# Patient Record
Sex: Male | Born: 1985 | Race: White | Hispanic: No | Marital: Single | Smoking: Current every day smoker
Health system: Southern US, Community
[De-identification: ages and names within clinical notes are randomized; demographics above are authoritative.]

## PROBLEM LIST (undated history)

## (undated) DIAGNOSIS — F32A Depression, unspecified: Secondary | ICD-10-CM

## (undated) DIAGNOSIS — F329 Major depressive disorder, single episode, unspecified: Secondary | ICD-10-CM

---
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
## 2008-02-20 ENCOUNTER — Emergency Department (HOSPITAL_COMMUNITY): Admission: EM | Admit: 2008-02-20 | Discharge: 2008-02-20 | Payer: Self-pay | Admitting: Emergency Medicine

## 2011-06-25 ENCOUNTER — Inpatient Hospital Stay (INDEPENDENT_AMBULATORY_CARE_PROVIDER_SITE_OTHER)
Admission: RE | Admit: 2011-06-25 | Discharge: 2011-06-25 | Disposition: A | Discharge status: Home / Self Care | Payer: Self-pay | Source: Ambulatory Visit | Attending: Family Medicine | Admitting: Family Medicine

## 2011-06-25 ENCOUNTER — Emergency Department (HOSPITAL_COMMUNITY): Payer: Self-pay

## 2011-06-25 ENCOUNTER — Emergency Department (HOSPITAL_COMMUNITY)
Admission: EM | Admit: 2011-06-25 | Discharge: 2011-06-25 | Disposition: A | Discharge status: Home / Self Care | Payer: Self-pay | Attending: Emergency Medicine | Admitting: Emergency Medicine

## 2011-06-25 DIAGNOSIS — H5710 Ocular pain, unspecified eye: Secondary | ICD-10-CM | POA: Insufficient documentation

## 2011-06-25 DIAGNOSIS — IMO0002 Reserved for concepts with insufficient information to code with codable children: Secondary | ICD-10-CM | POA: Insufficient documentation

## 2011-06-25 DIAGNOSIS — S1093XA Contusion of unspecified part of neck, initial encounter: Secondary | ICD-10-CM | POA: Insufficient documentation

## 2011-06-25 DIAGNOSIS — R51 Headache: Secondary | ICD-10-CM | POA: Insufficient documentation

## 2011-06-25 DIAGNOSIS — R42 Dizziness and giddiness: Secondary | ICD-10-CM | POA: Insufficient documentation

## 2011-06-25 DIAGNOSIS — S0510XA Contusion of eyeball and orbital tissues, unspecified eye, initial encounter: Secondary | ICD-10-CM | POA: Insufficient documentation

## 2011-06-25 DIAGNOSIS — S0003XA Contusion of scalp, initial encounter: Secondary | ICD-10-CM | POA: Insufficient documentation

## 2011-06-25 DIAGNOSIS — W19XXXA Unspecified fall, initial encounter: Secondary | ICD-10-CM | POA: Insufficient documentation

## 2011-06-25 DIAGNOSIS — S0990XA Unspecified injury of head, initial encounter: Secondary | ICD-10-CM | POA: Insufficient documentation

## 2014-03-14 ENCOUNTER — Encounter (HOSPITAL_COMMUNITY): Payer: Self-pay | Admitting: Emergency Medicine

## 2014-03-14 ENCOUNTER — Emergency Department (HOSPITAL_COMMUNITY)
Admission: EM | Admit: 2014-03-14 | Discharge: 2014-03-15 | Disposition: A | Discharge status: Home / Self Care | Payer: Self-pay | Attending: Emergency Medicine | Admitting: Emergency Medicine

## 2014-03-14 DIAGNOSIS — Z5900 Homelessness unspecified: Secondary | ICD-10-CM | POA: Insufficient documentation

## 2014-03-14 DIAGNOSIS — F17200 Nicotine dependence, unspecified, uncomplicated: Secondary | ICD-10-CM | POA: Insufficient documentation

## 2014-03-14 DIAGNOSIS — F1010 Alcohol abuse, uncomplicated: Secondary | ICD-10-CM | POA: Insufficient documentation

## 2014-03-14 HISTORY — DX: Major depressive disorder, single episode, unspecified: F32.9

## 2014-03-14 HISTORY — DX: Depression, unspecified: F32.A

## 2014-03-14 LAB — COMPREHENSIVE METABOLIC PANEL
ALBUMIN: 4.2 g/dL (ref 3.5–5.2)
ALT: 206 U/L — AB (ref 0–53)
AST: 138 U/L — AB (ref 0–37)
Alkaline Phosphatase: 117 U/L (ref 39–117)
BILIRUBIN TOTAL: 0.9 mg/dL (ref 0.3–1.2)
BUN: 10 mg/dL (ref 6–23)
CALCIUM: 10.1 mg/dL (ref 8.4–10.5)
CHLORIDE: 93 meq/L — AB (ref 96–112)
CO2: 26 mEq/L (ref 19–32)
CREATININE: 0.86 mg/dL (ref 0.50–1.35)
GFR calc Af Amer: >90 mL/min (ref >90)
GFR calc non Af Amer: >90 mL/min (ref >90)
Glucose, Bld: 89 mg/dL (ref 70–99)
Potassium: 4.4 mEq/L (ref 3.7–5.3)
SODIUM: 137 meq/L (ref 137–147)
Total Protein: 7.5 g/dL (ref 6.0–8.3)

## 2014-03-14 LAB — CBC
HCT: 46.7 % (ref 39.0–52.0)
Hemoglobin: 16.1 g/dL (ref 13.0–17.0)
MCH: 30.8 pg (ref 26.0–34.0)
MCHC: 34.5 g/dL (ref 30.0–36.0)
MCV: 89.5 fL (ref 78.0–100.0)
PLATELETS: 167 10*3/uL (ref 150–400)
RBC: 5.22 MIL/uL (ref 4.22–5.81)
RDW: 13.4 % (ref 11.5–15.5)
WBC: 6.4 10*3/uL (ref 4.0–10.5)

## 2014-03-14 LAB — RAPID URINE DRUG SCREEN, HOSP PERFORMED
AMPHETAMINES: NOT DETECTED
BARBITURATES: NOT DETECTED
Benzodiazepines: NOT DETECTED
COCAINE: NOT DETECTED
OPIATES: NOT DETECTED
TETRAHYDROCANNABINOL: NOT DETECTED

## 2014-03-14 LAB — ETHANOL: Alcohol, Ethyl (B): 52 mg/dL — ABNORMAL HIGH (ref 0–11)

## 2014-03-14 MED ORDER — VITAMIN B-1 100 MG PO TABS
100.0000 mg | ORAL_TABLET | Freq: Every day | ORAL | Status: DC
  Administered 2014-03-14 – 2014-03-15 (×2): 100 mg via ORAL
  Filled 2014-03-14 (×2): qty 1

## 2014-03-14 MED ORDER — IBUPROFEN 200 MG PO TABS: 600.0000 mg | ORAL_TABLET | Freq: Three times a day (TID) | ORAL | Status: DC | PRN

## 2014-03-14 MED ORDER — ACETAMINOPHEN 325 MG PO TABS: 650.0000 mg | ORAL_TABLET | ORAL | Status: DC | PRN

## 2014-03-14 MED ORDER — ONDANSETRON HCL 4 MG PO TABS
4.0000 mg | ORAL_TABLET | Freq: Three times a day (TID) | ORAL | Status: DC | PRN
  Administered 2014-03-15: 4 mg via ORAL
  Filled 2014-03-14: qty 1

## 2014-03-14 MED ORDER — FOLIC ACID 1 MG PO TABS
1.0000 mg | ORAL_TABLET | Freq: Every day | ORAL | Status: DC
  Administered 2014-03-14 – 2014-03-15 (×2): 1 mg via ORAL
  Filled 2014-03-14 (×2): qty 1

## 2014-03-14 MED ORDER — ZOLPIDEM TARTRATE 5 MG PO TABS: 5.0000 mg | ORAL_TABLET | Freq: Every evening | ORAL | Status: DC | PRN

## 2014-03-14 MED ORDER — NICOTINE 21 MG/24HR TD PT24
21.0000 mg | MEDICATED_PATCH | Freq: Every day | TRANSDERMAL | Status: DC
  Filled 2014-03-14: qty 1

## 2014-03-14 MED ORDER — LORAZEPAM 1 MG PO TABS: 0.0000 mg | ORAL_TABLET | Freq: Two times a day (BID) | ORAL | Status: DC

## 2014-03-14 MED ORDER — LORAZEPAM 1 MG PO TABS
0.0000 mg | ORAL_TABLET | Freq: Four times a day (QID) | ORAL | Status: DC
  Administered 2014-03-14 – 2014-03-15 (×2): 1 mg via ORAL
  Administered 2014-03-15: 2 mg via ORAL
  Filled 2014-03-14: qty 2
  Filled 2014-03-14 (×2): qty 1

## 2014-03-14 MED ORDER — ALUM & MAG HYDROXIDE-SIMETH 200-200-20 MG/5ML PO SUSP: 30.0000 mL | ORAL | Status: DC | PRN

## 2014-03-14 NOTE — ED Provider Notes (Signed)
TIME SEEN: 8:20 PM  CHIEF COMPLAINT: "I need detox from alcohol"  HPI: Patient is a 28 y.o. M with history of alcohol abuse for many years who presents emergency department requesting detox. He states that he was sober for approximately 149 days last year after a stent in rehabilitation. He states that he has been drinking again heavily and drinks approximately 4 40 ounce beers daily. He does smoke marijuana occasionally but denies any other drug use. He has had intermittent vomiting but no other medical complaints. No SI, HI or hallucinations. He states when he stops streaking he become shaky, sweaty and began to vomit. No history of withdrawal seizures or hallucinations. He states he has an appointment to go to [facility] tomorrow but feels that he needs inpatient detox.  ROS: See HPI Constitutional: no fever  Eyes: no drainage  ENT: no runny nose   Cardiovascular:  no chest pain  Resp: no SOB  GI: no vomiting GU: no dysuria Integumentary: no rash  Allergy: no hives  Musculoskeletal: no leg swelling  Neurological: no slurred speech ROS otherwise negative  PAST MEDICAL HISTORY/PAST SURGICAL HISTORY:  History reviewed. No pertinent past medical history.  MEDICATIONS:  Prior to Admission medications   Not on File    ALLERGIES:  No Known Allergies  SOCIAL HISTORY:  History  Substance Use Topics  . Smoking status: Current Every Day Smoker    Types: Cigarettes  . Smokeless tobacco: Not on file  . Alcohol Use: 2.4 oz/week    4 Cans of beer per week    FAMILY HISTORY: No family history on file.  EXAM: BP 137/88  Pulse 87  Temp(Src) 98.6 F (37 C) (Oral)  Resp 18  SpO2 97% CONSTITUTIONAL: Alert and oriented and responds appropriately to questions. Well-appearing; well-nourished, smells of alcohol HEAD: Normocephalic EYES: Conjunctivae clear, PERRL ENT: normal nose; no rhinorrhea; moist mucous membranes; pharynx without lesions noted NECK: Supple, no meningismus, no LAD   CARD: RRR; S1 and S2 appreciated; no murmurs, no clicks, no rubs, no gallops RESP: Normal chest excursion without splinting or tachypnea; breath sounds clear and equal bilaterally; no wheezes, no rhonchi, no rales,  ABD/GI: Normal bowel sounds; non-distended; soft, non-tender, no rebound, no guarding BACK:  The back appears normal and is non-tender to palpation, there is no CVA tenderness EXT: Normal ROM in all joints; non-tender to palpation; no edema; normal capillary refill; no cyanosis    SKIN: Normal color for age and race; warm NEURO: Moves all extremities equally PSYCH: The patient's mood and manner are appropriate. Grooming and personal hygiene are appropriate. No SI, HI or hallucinations  MEDICAL DECISION MAKING: Patient here requesting alcohol detox. He states he becomes diaphoretic, anxious, tremulous when he stops drinking. Last drink was just prior to arrival. Denies any current medical complaints. We'll obtain screening labs and urine and consult TTS. Patient is here voluntarily.  ED PROGRESS: Patient's labs are unremarkable other than mild elevation of his AST greater than ALT consistent with alcohol abuse. His alcohol level is 52. He is currently medically cleared. Awaiting TTS evaluation.     Layla Maw Niclas Markell, DO 03/15/14 0008

## 2014-03-14 NOTE — ED Notes (Signed)
Pt alert, arrives from home, c/o detox from alcohol, last drink was pta, pt reports drinking "(4) forty's/day"

## 2014-03-14 NOTE — ED Notes (Signed)
Pt belongings include:  Black wallet Loose change; quarters, nickels White t-shirt White socks Red basketball style shorts Underwear White shirt White and black shirt Black and silver shorts Half a bag of Fritos White Tennis Shoes Black Sandals   Belongs logged by [place], Advertising copywriter 1 on this date at this time.

## 2014-03-14 NOTE — ED Notes (Signed)
Pt has been wanded by security and belongings searched.

## 2014-03-15 ENCOUNTER — Encounter (HOSPITAL_COMMUNITY): Payer: Self-pay | Admitting: Emergency Medicine

## 2014-03-15 DIAGNOSIS — F1020 Alcohol dependence, uncomplicated: Secondary | ICD-10-CM

## 2014-03-15 MED ORDER — LORAZEPAM 1 MG PO TABS: 1.0000 mg | ORAL_TABLET | ORAL | Status: AC | PRN

## 2014-03-15 NOTE — BH Assessment (Signed)
Tele Assessment Note   Duane Mclean is a 28 y.o. male who presents to [facility] for alcohol detox. Pt denies SI/HI/AVH.  Pt reports the following: he is going through a "bad breakup" and has been drinking excessively. Pt says he had 149 of sobriety and relapsed on 09/2013. Pt drinks 3-4 40's or more, daily, last drink was 03/14/14.  Pt drank 1-12oz beer.  Pt says he occasionally uses 1 marijuana joint, last use was 3 days ago.  Pt says he is homeless and is currently living with his parents.  Pt denies seizures or blackouts.  He is c/o w/d sxs: hot flashes, tremors, anxiety and insomnia.   Axis I: Alcohol Abuse  Axis II: Deferred Axis III:  Past Medical History  Diagnosis Date  . Depression    Axis IV: economic problems, housing problems, other psychosocial or environmental problems, problems related to social environment and problems with primary support group Axis V: 41-50 serious symptoms  Past Medical History:  Past Medical History  Diagnosis Date  . Depression     History reviewed. No pertinent past surgical history.  Family History: No family history on file.  Social History:  reports that he has been smoking Cigarettes.  He has been smoking about 0.00 packs per day. He does not have any smokeless tobacco history on file. He reports that he drinks about 2.4 ounces of alcohol per week. His drug history is not on file.  Additional Social History:  Alcohol / Drug Use Pain Medications: None  Prescriptions: None  Over the Counter: None  History of alcohol / drug use?: Yes Longest period of sobriety (when/how long): 149 days sobriety; relapsed 09/2013 Negative Consequences of Use: Work / School;Personal relationships;Financial Withdrawal Symptoms: Fever / Chills;Tremors;Other (Comment) (anxiety, insomnia ) Substance #1 Name of Substance 1: Alcohol  1 - Age of First Use: 13 YOM  1 - Amount (size/oz): 3-4 40's or more  1 - Frequency: Daily  1 - Duration: On-going  1 - Last Use /  Amount: 03/14/14 Substance #2 Name of Substance 2: THC  2 - Age of First Use: Teens  2 - Amount (size/oz): 1 Joint  2 - Frequency: Occasionally  2 - Duration: On-going  2 - Last Use / Amount: 3 Days Ago   CIWA: CIWA-Ar BP: 134/93 mmHg Pulse Rate: 112 Nausea and Vomiting: no nausea and no vomiting Tactile Disturbances: moderate itching, pins and needles, burning or numbness Tremor: moderate, with patient's arms extended Auditory Disturbances: not present Paroxysmal Sweats: barely perceptible sweating, palms moist Visual Disturbances: not present Anxiety: two Headache, Fullness in Head: very mild Agitation: normal activity Orientation and Clouding of Sensorium: oriented and can do serial additions CIWA-Ar Total: 11 COWS:    Allergies: No Known Allergies  Home Medications:  (Not in a hospital admission)  OB/GYN Status:  No LMP for male patient.  General Assessment Data Location of Assessment: [facility] ED Is this a Tele or Face-to-Face Assessment?: Face-to-Face Is this an Initial Assessment or a Re-assessment for this encounter?: Initial Assessment Living Arrangements: Parent ("sleeping on my parents couch") Can pt return to current living arrangement?: Yes Admission Status: Voluntary Is patient capable of signing voluntary admission?: Yes Transfer from: Acute Hospital Referral Source: MD  Medical Screening Exam [facility] Walk-in ONLY) Medical Exam completed: No Reason for MSE not completed: Other: (None )  [facility] Crisis Care Plan Living Arrangements: Parent ("sleeping on my parents couch") Name of Psychiatrist: None  Name of Therapist: None   Education Status Is  patient currently in school?: No Current Grade: None  Highest grade of school patient has completed: None  Name of school: None  Contact person: None   Risk to self Suicidal Ideation: No Suicidal Intent: No Is patient at risk for suicide?: No Suicidal Plan?: No Access to Means: No What has been your use of  drugs/alcohol within the last 12 months?: Abusing: alcohol Previous Attempts/Gestures: No How many times?: 0 Other Self Harm Risks: None  Triggers for Past Attempts: None known Intentional Self Injurious Behavior: None Family Suicide History: No Recent stressful life event(s): Conflict (Comment);Loss (Comment) ("Bad breakup with girlfriend") Persecutory voices/beliefs?: No Depression: Yes Depression Symptoms: Loss of interest in usual pleasures Substance abuse history and/or treatment for substance abuse?: Yes Suicide prevention information given to non-admitted patients: Not applicable  Risk to Others Homicidal Ideation: No Thoughts of Harm to Others: No Current Homicidal Intent: No Current Homicidal Plan: No Access to Homicidal Means: No Identified Victim: None  History of harm to others?: No Assessment of Violence: None Noted Violent Behavior Description: None  Does patient have access to weapons?: No Criminal Charges Pending?: No Does patient have a court date: No  Psychosis Hallucinations: None noted Delusions: None noted  Mental Status Report Appear/Hygiene: Disheveled;In scrubs Eye Contact: Good Motor Activity: Unremarkable Speech: Logical/coherent Level of Consciousness: Alert Mood: Depressed Affect: Depressed Anxiety Level: None Thought Processes: Coherent;Relevant Judgement: Unimpaired Orientation: Person;Place;Time;Situation Obsessive Compulsive Thoughts/Behaviors: None  Cognitive Functioning Concentration: Normal Memory: Recent Intact;Remote Intact IQ: Average Insight: Fair Impulse Control: Fair Appetite: Fair Weight Loss: 0 Weight Gain: 0 Sleep: No Change Total Hours of Sleep: 6 Vegetative Symptoms: None  ADLScreening [facility] Assessment Services) Patient's cognitive ability adequate to safely complete daily activities?: Yes Patient able to express need for assistance with ADLs?: Yes Independently performs ADLs?: Yes (appropriate for  developmental age)  Prior Inpatient Therapy Prior Inpatient Therapy: Yes Prior Therapy Dates: 2014 Prior Therapy Facilty/Provider(s): [facility] Reason for Treatment: Rehab   Prior Outpatient Therapy Prior Outpatient Therapy: No Prior Therapy Dates: None  Prior Therapy Facilty/Provider(s): None  Reason for Treatment: None   ADL Screening (condition at time of admission) Patient's cognitive ability adequate to safely complete daily activities?: Yes Is the patient deaf or have difficulty hearing?: No Does the patient have difficulty seeing, even when wearing glasses/contacts?: No Does the patient have difficulty concentrating, remembering, or making decisions?: No Patient able to express need for assistance with ADLs?: Yes Does the patient have difficulty dressing or bathing?: No Independently performs ADLs?: Yes (appropriate for developmental age) Does the patient have difficulty walking or climbing stairs?: No Weakness of Legs: None Weakness of Arms/Hands: None  Home Assistive Devices/Equipment Home Assistive Devices/Equipment: None  Therapy Consults (therapy consults require a physician order) PT Evaluation Needed: No OT Evalulation Needed: No SLP Evaluation Needed: No Abuse/Neglect Assessment (Assessment to be complete while patient is alone) Physical Abuse: Denies Verbal Abuse: Denies Sexual Abuse: Denies Exploitation of patient/patient's resources: Denies Self-Neglect: Denies Values / Beliefs Cultural Requests During Hospitalization: None Spiritual Requests During Hospitalization: None Consults Spiritual Care Consult Needed: No Social Work Consult Needed: No Merchant navy officer (For Healthcare) Advance Directive: Patient does not have advance directive;Patient would not like information Pre-existing out of facility DNR order (yellow form or pink MOST form): No Nutrition Screen- [facility] Patient's home diet: Regular  Additional Information 1:1 In Past 12  Months?: No CIRT Risk: No Elopement Risk: No Does patient have medical clearance?: Yes     Disposition:  Disposition Initial Assessment Completed  for this Encounter: Yes Disposition of Patient: Inpatient treatment program;Referred to [facility] Type of inpatient treatment program: Adult Patient referred to: Other (Comment) [facility] )  Murrell Reddeneresa C Shawne Bulow 03/15/2014 4:04 AM

## 2014-03-15 NOTE — [facility] Counselor (Addendum)
Writer spoke w/ [employer]. She sts that pt needs to arrive by 8 am tomorrow 5/28 at [facility]. Pt will need to bring his meds and his clothes. She sts pt will have a bed at [facility] tomorrow am. Writer relayed this info to pt. Writer reminds pt that pt will need to stay clean and sober upon d/c from [facility] today until his admission to [facility] 8 am 5/28. Pt insists that he won't use any substances upon d/c from [facility]. Writer left voicemail for Melissa at [facility] notifying her that pt no longer needs placement.   Evette Cristal, [place] Assessment Counselor 12:24 pm   Writer called [facility] and left voicemail for counselor - [phone]. Pt sts he has spoken w/ [facility] and has a bed waiting for him.   Evette Cristal, [place] Assessment Counselor 11:47 am   Melissa at [facility] - they do have detox beds. Writer sent referral to [facility] fax [phone]. Adolph at [facility] - they have no male beds.   Evette Cristal, [place] Assessment Counselor 10:00am

## 2014-03-15 NOTE — [facility] Suicide Risk Assessment (Signed)
Suicide Risk Assessment  Discharge Assessment     Demographic Factors:  Male, Divorced or widowed and Unemployed  Total Time spent with patient: 30 minutes  Psychiatric Specialty Exam:     Blood pressure 150/104, pulse 102, temperature 98.4 F (36.9 C), temperature source Oral, resp. rate 18, SpO2 97.00%.There is no height or weight on file to calculate BMI.  General Appearance: Well Groomed  Patent attorney::  Good  Speech:  Clear and Coherent  Volume:  Normal  Mood:  Anxious  Affect:  Congruent  Thought Process:  Coherent  Orientation:  Full (Time, Place, and Person)  Thought Content:  Negative  Suicidal Thoughts:  No  Homicidal Thoughts:  No  Memory:  Immediate;   Good Recent;   Good Remote;   Good  Judgement:  Intact  Insight:  Fair  Psychomotor Activity:  Normal  Concentration:  Good  Recall:  Good  Fund of Knowledge:Good  Language: Good  Akathisia:  Negative  Handed:  Right  AIMS (if indicated):     Assets:  Communication Skills Desire for Improvement Physical Health Social Support Talents/Skills Transportation  Sleep:       Musculoskeletal: Strength & Muscle Tone: within normal limits Gait & Station: normal Patient leans: N/A   Mental Status Per Nursing Assessment::   On Admission:     Current Mental Status by Physician: NA  Loss Factors: NA  Historical Factors: NA  Risk Reduction Factors:   Sense of responsibility to family and Positive social support  Continued Clinical Symptoms:  Alcohol/Substance Abuse/Dependencies  Cognitive Features That Contribute To Risk:  none    Suicide Risk:  Minimal: No identifiable suicidal ideation.  Patients presenting with no risk factors but with morbid ruminations; may be classified as minimal risk based on the severity of the depressive symptoms  Discharge Diagnoses:   AXIS I:  Alcohol Abuse AXIS II:  Deferred AXIS III:   Past Medical History  Diagnosis Date  . Depression    AXIS IV:  economic  problems, housing problems, occupational problems and other psychosocial or environmental problems AXIS V:  51-60 moderate symptoms  Plan Of Care/Follow-up recommendations:  Activity:  resume usual activity Diet:  resume usual diet  Is patient on multiple antipsychotic therapies at discharge:  No   Has Patient had three or more failed trials of antipsychotic monotherapy by history:  No  Recommended Plan for Multiple Antipsychotic Therapies: NA    Benjaman Pott 03/15/2014, 1:38 PM

## 2014-03-15 NOTE — ED Notes (Signed)
Belongings returned to patient.

## 2014-03-15 NOTE — Consult Note (Signed)
[facility] Face-to-Face Psychiatry Consult   Reason for Consult:  Requesting detox from alcohol Referring Physician:  ER MD  Duane Mclean is an 28 y.o. male. Total Time spent with patient: 30 minutes  Assessment: AXIS I:  alcohol dependence AXIS II:  Deferred AXIS III:   Past Medical History  Diagnosis Date  . Depression    AXIS IV:  economic problems, housing problems and problems related to social environment AXIS V:  61-70 mild symptoms  Plan:  No evidence of imminent risk to self or others at present.    Subjective:   Duane Mclean is a 28 y.o. male patient admitted with requesting alcohol detox.  HPI:  Drinks 4 forty oz beers daily, the last drink yesterday at 5 pm.  Has been in rehab before but started drinking again.  Broke up with his girlfriend and is homeless for now.  Cannot see his 83 young boys.  He is on probation for assault on this same woman in the past.  Has been drinking since December last year daily. HPI Elements:   Location:  alcohol dependence. Quality:  daily drinking. Severity:  four 40 oz daily. Timing:  recent break up with his girlfriend. Duration:  drinking since he was 67 years old. Context:  as above.  Past Psychiatric History: Past Medical History  Diagnosis Date  . Depression     reports that he has been smoking Cigarettes.  He has been smoking about 0.00 packs per day. He does not have any smokeless tobacco history on file. He reports that he drinks about 2.4 ounces of alcohol per week. His drug history is not on file. No family history on file. Family History Substance Abuse: No Family Supports: No Living Arrangements: Parent ("sleeping on my parents couch") Can pt return to current living arrangement?: Yes Abuse/Neglect [facility]) Physical Abuse: Denies Verbal Abuse: Denies Sexual Abuse: Denies Allergies:  No Known Allergies  ACT Assessment Complete:  Yes:    Educational Status    Risk to Self: Risk to self Suicidal Ideation: No Suicidal  Intent: No Is patient at risk for suicide?: No Suicidal Plan?: No Access to Means: No What has been your use of drugs/alcohol within the last 12 months?: Abusing: alcohol Previous Attempts/Gestures: No How many times?: 0 Other Self Harm Risks: None  Triggers for Past Attempts: None known Intentional Self Injurious Behavior: None Family Suicide History: No Recent stressful life event(s): Conflict (Comment);Loss (Comment) ("Bad breakup with girlfriend") Persecutory voices/beliefs?: No Depression: Yes Depression Symptoms: Loss of interest in usual pleasures Substance abuse history and/or treatment for substance abuse?: Yes Suicide prevention information given to non-admitted patients: Not applicable  Risk to Others: Risk to Others Homicidal Ideation: No Thoughts of Harm to Others: No Current Homicidal Intent: No Current Homicidal Plan: No Access to Homicidal Means: No Identified Victim: None  History of harm to others?: No Assessment of Violence: None Noted Violent Behavior Description: None  Does patient have access to weapons?: No Criminal Charges Pending?: No Does patient have a court date: No  Abuse: Abuse/Neglect Assessment (Assessment to be complete while patient is alone) Physical Abuse: Denies Verbal Abuse: Denies Sexual Abuse: Denies Exploitation of patient/patient's resources: Denies Self-Neglect: Denies  Prior Inpatient Therapy: Prior Inpatient Therapy Prior Inpatient Therapy: Yes Prior Therapy Dates: 2014 Prior Therapy Facilty/Provider(s): [facility] Reason for Treatment: Rehab   Prior Outpatient Therapy: Prior Outpatient Therapy Prior Outpatient Therapy: No Prior Therapy Dates: None  Prior Therapy Facilty/Provider(s): None  Reason for Treatment: None  Additional Information: Additional Information 1:1 In Past 12 Months?: No CIRT Risk: No Elopement Risk: No Does patient have medical clearance?: Yes                  Objective: Blood pressure  150/104, pulse 102, temperature 98.4 F (36.9 C), temperature source Oral, resp. rate 18, SpO2 97.00%.There is no height or weight on file to calculate BMI. Results for orders placed during the hospital encounter of 03/14/14 (from the past 72 hour(s))  CBC     Status: None   Collection Time    03/14/14  6:20 PM      Result Value Ref Range   WBC 6.4  4.0 - 10.5 K/uL   RBC 5.22  4.22 - 5.81 MIL/uL   Hemoglobin 16.1  13.0 - 17.0 g/dL   HCT 46.7  39.0 - 52.0 %   MCV 89.5  78.0 - 100.0 fL   MCH 30.8  26.0 - 34.0 pg   MCHC 34.5  30.0 - 36.0 g/dL   RDW 13.4  11.5 - 15.5 %   Platelets 167  150 - 400 K/uL  COMPREHENSIVE METABOLIC PANEL     Status: Abnormal   Collection Time    03/14/14  6:20 PM      Result Value Ref Range   Sodium 137  137 - 147 mEq/L   Potassium 4.4  3.7 - 5.3 mEq/L   Chloride 93 (*) 96 - 112 mEq/L   CO2 26  19 - 32 mEq/L   Glucose, Bld 89  70 - 99 mg/dL   BUN 10  6 - 23 mg/dL   Creatinine, Ser 0.86  0.50 - 1.35 mg/dL   Calcium 10.1  8.4 - 10.5 mg/dL   Total Protein 7.5  6.0 - 8.3 g/dL   Albumin 4.2  3.5 - 5.2 g/dL   AST 138 (*) 0 - 37 U/L   ALT 206 (*) 0 - 53 U/L   Alkaline Phosphatase 117  39 - 117 U/L   Total Bilirubin 0.9  0.3 - 1.2 mg/dL   GFR calc non Af Amer >90  >90 mL/min   GFR calc Af Amer >90  >90 mL/min   Comment: (NOTE)     The eGFR has been calculated using the CKD EPI equation.     This calculation has not been validated in all clinical situations.     eGFR's persistently <90 mL/min signify possible Chronic Kidney     Disease.  ETHANOL     Status: Abnormal   Collection Time    03/14/14  6:20 PM      Result Value Ref Range   Alcohol, Ethyl (B) 52 (*) 0 - 11 mg/dL   Comment:            LOWEST DETECTABLE LIMIT FOR     SERUM ALCOHOL IS 11 mg/dL     FOR MEDICAL PURPOSES ONLY  URINE RAPID DRUG SCREEN (HOSP PERFORMED)     Status: None   Collection Time    03/14/14  6:50 PM      Result Value Ref Range   Opiates NONE DETECTED  NONE DETECTED    Cocaine NONE DETECTED  NONE DETECTED   Benzodiazepines NONE DETECTED  NONE DETECTED   Amphetamines NONE DETECTED  NONE DETECTED   Tetrahydrocannabinol NONE DETECTED  NONE DETECTED   Barbiturates NONE DETECTED  NONE DETECTED   Comment:            DRUG SCREEN FOR MEDICAL PURPOSES  ONLY.  IF CONFIRMATION IS NEEDED     FOR ANY PURPOSE, NOTIFY LAB     WITHIN 5 DAYS.                LOWEST DETECTABLE LIMITS     FOR URINE DRUG SCREEN     Drug Class       Cutoff (ng/mL)     Amphetamine      1000     Barbiturate      200     Benzodiazepine   448     Tricyclics       185     Opiates          300     Cocaine          300     THC              50   Labs are reviewed and are pertinent for BAL of 52.  Current Facility-Administered Medications  Medication Dose Route Frequency Provider Last Rate Last Dose  . acetaminophen (TYLENOL) tablet 650 mg  650 mg Oral Q4H PRN Kristen N Ward, DO      . alum & mag hydroxide-simeth (MAALOX/MYLANTA) 200-200-20 MG/5ML suspension 30 mL  30 mL Oral PRN Kristen N Ward, DO      . folic acid (FOLVITE) tablet 1 mg  1 mg Oral Daily Kristen N Ward, DO   1 mg at 03/15/14 0901  . ibuprofen (ADVIL,MOTRIN) tablet 600 mg  600 mg Oral Q8H PRN Kristen N Ward, DO      . LORazepam (ATIVAN) tablet 0-4 mg  0-4 mg Oral 4 times per day Kristen N Ward, DO   1 mg at 03/15/14 1101   Followed by  . [START ON 03/16/2014] LORazepam (ATIVAN) tablet 0-4 mg  0-4 mg Oral Q12H Kristen N Ward, DO      . nicotine (NICODERM CQ - dosed in mg/24 hours) patch 21 mg  21 mg Transdermal Daily Kristen N Ward, DO      . ondansetron (ZOFRAN) tablet 4 mg  4 mg Oral Q8H PRN Kristen N Ward, DO   4 mg at 03/15/14 0901  . thiamine (VITAMIN B-1) tablet 100 mg  100 mg Oral Daily Kristen N Ward, DO   100 mg at 03/15/14 0901  . zolpidem (AMBIEN) tablet 5 mg  5 mg Oral QHS PRN Kristen N Ward, DO       No current outpatient prescriptions on file.    Psychiatric Specialty Exam:     Blood pressure 150/104,  pulse 102, temperature 98.4 F (36.9 C), temperature source Oral, resp. rate 18, SpO2 97.00%.There is no height or weight on file to calculate BMI.  General Appearance: Well Groomed  Engineer, water::  Good  Speech:  Clear and Coherent  Volume:  Normal  Mood:  Anxious  Affect:  Congruent  Thought Process:  Coherent  Orientation:  Full (Time, Place, and Person)  Thought Content:  Negative  Suicidal Thoughts:  No  Homicidal Thoughts:  No  Memory:  Immediate;   Good Recent;   Good Remote;   Good  Judgement:  Intact  Insight:  Fair  Psychomotor Activity:  Normal  Concentration:  Good  Recall:  Good  Fund of Knowledge:Good  Language: Good  Akathisia:  Negative  Handed:  Right  AIMS (if indicated):     Assets:  Communication Skills Desire for Improvement Physical Health Social Support Transportation  Sleep:      Musculoskeletal:  Strength & Muscle Tone: within normal limits Gait & Station: normal Patient leans: N/A  Treatment Plan Summary: Has a bed at [facility] tomorrow.  Will discharge home to follow up tomorrow  Clarene Reamer 03/15/2014 1:22 PM

## 2014-03-15 NOTE — Progress Notes (Signed)
[facility] provided pt with a [employer] Orange Card application, highlighting [facility].  °

## 2015-05-17 ENCOUNTER — Encounter (HOSPITAL_COMMUNITY): Payer: Self-pay | Admitting: Neurology

## 2015-05-17 ENCOUNTER — Emergency Department (HOSPITAL_COMMUNITY)
Admission: EM | Admit: 2015-05-17 | Discharge: 2015-05-17 | Disposition: A | Discharge status: Home / Self Care | Payer: Self-pay | Attending: Emergency Medicine | Admitting: Emergency Medicine

## 2015-05-17 DIAGNOSIS — Z8659 Personal history of other mental and behavioral disorders: Secondary | ICD-10-CM | POA: Insufficient documentation

## 2015-05-17 DIAGNOSIS — F1020 Alcohol dependence, uncomplicated: Secondary | ICD-10-CM | POA: Insufficient documentation

## 2015-05-17 DIAGNOSIS — Z720 Tobacco use: Secondary | ICD-10-CM | POA: Insufficient documentation

## 2015-05-17 DIAGNOSIS — R000 Tachycardia, unspecified: Secondary | ICD-10-CM | POA: Insufficient documentation

## 2015-05-17 MED ORDER — CHLORDIAZEPOXIDE HCL 25 MG PO CAPS: ORAL_CAPSULE | ORAL | Status: AC

## 2015-05-17 MED ORDER — HYDROXYZINE HCL 25 MG PO TABS: 25.0000 mg | ORAL_TABLET | Freq: Four times a day (QID) | ORAL | Status: AC

## 2015-05-17 NOTE — ED Notes (Signed)
Pt here for detox from ETOH, last drank this morning 3 12 oz Bud Light. On typical day he drinks 2- 40 oz and 2-25oz beers. Denies SI or HI.

## 2015-05-17 NOTE — ED Provider Notes (Signed)
CSN: [id number]     Arrival date & time 05/17/15  1023 History   First MD Initiated Contact with Patient 05/17/15 1038     Chief Complaint  Patient presents with  . Alcohol Problem     (Consider location/radiation/quality/duration/timing/severity/associated sxs/prior Treatment) HPI MAKEL MCMANN Physical 29 year old male with a past medical history of depression and alcohol abuse. He is presenting to the emergency department for help with alcohol withdrawal. The patient states that he has been drinking heavily since he was 29 years old. He was seen here in May and was able to stay sober until recently. The patient has again been drinking heavily, stating he drinks about 2,40 ounce malt liquors daily along with 2,24 ounce beers. He also will drink more if it's not enough. He does get the shakes when he stops drinking. Patient states about 5 days ago he became fed up and tried to quit cold [place]. Predominantly he feels anxious, has been unable to sleep. He denies any history of seizures or delirium tremens symptoms. Patient states that he has been very angry and upset coming off of alcohol. Patient does not want detox at this time but would like help with detox on a protocol for withdrawal. The patient denies any suicidal ideation, homicidal ideation or other drug abuse. Was 3, 12 ounce beers this morning prior to arrival. Past Medical History  Diagnosis Date  . Depression    History reviewed. No pertinent past surgical history. No family history on file. History  Substance Use Topics  . Smoking status: Current Every Day Smoker    Types: Cigarettes  . Smokeless tobacco: Not on file  . Alcohol Use: 2.4 oz/week    4 Cans of beer per week     Comment: 3-4 40's or more, daily     Review of Systems   Ten systems reviewed and are negative for acute change, except as noted in the HPI.   Allergies  Review of patient's allergies indicates no known allergies.  Home Medications   Prior to  Admission medications   Medication Sig Start Date End Date Taking? Authorizing Provider  chlordiazePOXIDE (LIBRIUM) 25 MG capsule Take 1 capsule 4 times a day for 2 days Take 1 capsule 3 times a day for 2 days Take 1 caspule 2 times a day for 2 days Take 1 capsule daily for 2 days 05/17/15   Arthor Captain, PA-C  hydrOXYzine (ATARAX/VISTARIL) 25 MG tablet Take 1 tablet (25 mg total) by mouth every 6 (six) hours. 05/17/15   Arthor Captain, PA-C  LORazepam (ATIVAN) 1 MG tablet Take 1 tablet (1 mg total) by mouth every 4 (four) hours as needed for anxiety. 03/15/14   Benjaman Pott, MD   BP 127/79 mmHg  Pulse 96  Temp(Src) 97.9 F (36.6 C) (Oral)  Resp 16  SpO2 96% Physical Exam  Constitutional: He appears well-developed and well-nourished. No distress.  HENT:  Head: Normocephalic and atraumatic.  Eyes: Conjunctivae are normal. No scleral icterus.  Neck: Normal range of motion. Neck supple.  Cardiovascular: Regular rhythm and normal heart sounds.   Tachycardic  Pulmonary/Chest: Effort normal and breath sounds normal. No respiratory distress.  Abdominal: Soft. There is no tenderness.  Musculoskeletal: He exhibits no edema.  Neurological: He is alert.  Skin: Skin is warm and dry. He is not diaphoretic.  Psychiatric: His behavior is normal.  Nursing note and vitals reviewed.   ED Course  Procedures (including critical care time) Labs Review Labs Reviewed - No  data to display  Imaging Review No results found.   EKG Interpretation None      MDM   Final diagnoses:  Uncomplicated alcohol dependence   Patient with alcohol abuse and dependence, seeking detox protocol. Patient has no suicidal or homicidal ideations. He does not want inpatient detox. He has some mild hypertension and elevated heart rate, likely secondary to withdrawal from alcohol. He appears stable, however. Given the patient are Librium alcohol detox protocol to take at home. I warned the patient thoroughly not  to drink and take Librium. He states that he is only desirous to quit. Patient appears safe for discharge at this time. I discussed return precautions with the patient.    Arthor Captain, PA-C 05/17/15 1201  Nelva Nay, MD 05/18/15 [phone]

## 2015-05-17 NOTE — Discharge Instructions (Signed)
Alcohol Use Disorder °Alcohol use disorder is a mental disorder. It is not a one-time incident of heavy drinking. Alcohol use disorder is the excessive and uncontrollable use of alcohol over time that leads to problems with functioning in one or more areas of daily living. People with this disorder risk harming themselves and others when they drink to excess. Alcohol use disorder also can cause other mental disorders, such as mood and anxiety disorders, and serious physical problems. People with alcohol use disorder often misuse other drugs.  °Alcohol use disorder is common and widespread. Some people with this disorder drink alcohol to cope with or escape from negative life events. Others drink to relieve chronic pain or symptoms of mental illness. People with a family history of alcohol use disorder are at higher risk of losing control and using alcohol to excess.  °SYMPTOMS  °Signs and symptoms of alcohol use disorder may include the following:  °· Consumption of alcohol in larger amounts or over a longer period of time than intended. °· Multiple unsuccessful attempts to cut down or control alcohol use.   °· A great deal of time spent obtaining alcohol, using alcohol, or recovering from the effects of alcohol (hangover). °· A strong desire or urge to use alcohol (cravings).   °· Continued use of alcohol despite problems at work, school, or home because of alcohol use.   °· Continued use of alcohol despite problems in relationships because of alcohol use. °· Continued use of alcohol in situations when it is physically hazardous, such as driving a car. °· Continued use of alcohol despite awareness of a physical or psychological problem that is likely related to alcohol use. Physical problems related to alcohol use can involve the brain, heart, liver, stomach, and intestines. Psychological problems related to alcohol use include intoxication, depression, anxiety, psychosis, delirium, and dementia.   °· The need for  increased amounts of alcohol to achieve the same desired effect, or a decreased effect from the consumption of the same amount of alcohol (tolerance). °· Withdrawal symptoms upon reducing or stopping alcohol use, or alcohol use to reduce or avoid withdrawal symptoms. Withdrawal symptoms include: °· Racing heart. °· Hand tremor. °· Difficulty sleeping. °· Nausea. °· Vomiting. °· Hallucinations. °· Restlessness. °· Seizures. °DIAGNOSIS °Alcohol use disorder is diagnosed through an assessment by your health care provider. Your health care provider may start by asking three or four questions to screen for excessive or problematic alcohol use. To confirm a diagnosis of alcohol use disorder, at least two symptoms must be present within a 12-month period. The severity of alcohol use disorder depends on the number of symptoms: °· Mild--two or three. °· Moderate--four or five. °· Severe--six or more. °Your health care provider may perform a physical exam or use results from lab tests to see if you have physical problems resulting from alcohol use. Your health care provider may refer you to a mental health professional for evaluation. °TREATMENT  °Some people with alcohol use disorder are able to reduce their alcohol use to low-risk levels. Some people with alcohol use disorder need to quit drinking alcohol. When necessary, mental health professionals with specialized training in substance use treatment can help. Your health care provider can help you decide how severe your alcohol use disorder is and what type of treatment you need. The following forms of treatment are available:  °· Detoxification. Detoxification involves the use of prescription medicines to prevent alcohol withdrawal symptoms in the first week after quitting. This is important for people with a history of symptoms   of withdrawal and for heavy drinkers who are likely to have withdrawal symptoms. Alcohol withdrawal can be dangerous and, in severe cases, cause  death. Detoxification is usually provided in a hospital or in-patient substance use treatment facility.  Counseling or talk therapy. Talk therapy is provided by substance use treatment counselors. It addresses the reasons people use alcohol and ways to keep them from drinking again. The goals of talk therapy are to help people with alcohol use disorder find healthy activities and ways to cope with life stress, to identify and avoid triggers for alcohol use, and to handle cravings, which can cause relapse.  Medicines.Different medicines can help treat alcohol use disorder through the following actions:  Decrease alcohol cravings.  Decrease the positive reward response felt from alcohol use.  Produce an uncomfortable physical reaction when alcohol is used (aversion therapy).  Support groups. Support groups are run by people who have quit drinking. They provide emotional support, advice, and guidance. These forms of treatment are often combined. Some people with alcohol use disorder benefit from intensive combination treatment provided by specialized substance use treatment centers. Both inpatient and outpatient treatment programs are available. Document Released: 11/13/2004 Document Revised: 02/20/2014 Document Reviewed: 01/13/2013 [facility] Patient Information 2015 [place], [place]. This information is not intended to replace advice given to you by your health care provider. Make sure you discuss any questions you have with your health care provider.  Alcohol Withdrawal Anytime drug use is interfering with normal living activities it has become abuse. This includes problems with family and friends. Psychological dependence has developed when your mind tells you that the drug is needed. This is usually followed by physical dependence when a continuing increase of drugs are required to get the same feeling or "high." This is known as addiction or chemical dependency. A person's risk is much higher if  there is a history of chemical dependency in the family. Mild Withdrawal Following Stopping Alcohol, When Addiction or Chemical Dependency Has Developed When a person has developed tolerance to alcohol, any sudden stopping of alcohol can cause uncomfortable physical symptoms. Most of the time these are mild and consist of tremors in the hands and increases in heart rate, breathing, and temperature. Sometimes these symptoms are associated with anxiety, panic attacks, and bad dreams. There may also be stomach upset. Normal sleep patterns are often interrupted with periods of inability to sleep (insomnia). This may last for 6 months. Because of this discomfort, many people choose to continue drinking to get rid of this discomfort and to try to feel normal. Severe Withdrawal with Decreased or No Alcohol Intake, When Addiction or Chemical Dependency Has Developed About five percent of alcoholics will develop signs of severe withdrawal when they stop using alcohol. One sign of this is development of generalized seizures (convulsions). Other signs of this are severe agitation and confusion. This may be associated with believing in things which are not real or seeing things which are not really there (delusions and hallucinations). Vitamin deficiencies are usually present if alcohol intake has been long-term. Treatment for this most often requires hospitalization and close observation. Addiction can only be helped by stopping use of all chemicals. This is hard but may save your life. With continual alcohol use, possible outcomes are usually loss of self respect and esteem, violence, and death. Addiction cannot be cured but it can be stopped. This often requires outside help and the care of professionals. Treatment centers are listed in the yellow pages under Cocaine, Narcotics, and Alcoholics  Narcotics, and Alcoholics Anonymous. Most hospitals and clinics can refer you to a specialized care center. °It is not necessary for you to go  through the uncomfortable symptoms of withdrawal. Your caregiver can provide you with medicines that will help you through this difficult period. Try to avoid situations, friends, or drugs that made it possible for you to keep using alcohol in the past. Learn how to say no. °It takes a long period of time to overcome addictions to all drugs, including alcohol. There may be many times when you feel as though you want a drink. After getting rid of the physical addiction and withdrawal, you will have a lessening of the craving which tells you that you need alcohol to feel normal. Call your caregiver if more support is needed. Learn who to talk to in your family and among your friends so that during these periods you can receive outside help. Alcoholics Anonymous (AA) has helped many people over the years. To get further help, contact AA or call your caregiver, counselor, or clergyperson. Al-Anon and Alateen are support groups for friends and family members of an alcoholic. The people who love and care for an alcoholic often need help, too. For information about these organizations, check your phone directory or call a local alcoholism treatment center.  °SEEK IMMEDIATE MEDICAL CARE IF:  °· You have a seizure. °· You have a fever. °· You experience uncontrolled vomiting or you vomit up blood. This may be bright red or look like black coffee grounds. °· You have blood in the stool. This may be bright red or appear as a black, tarry, bad-smelling stool. °· You become lightheaded or faint. Do not drive if you feel this way. Have someone else drive you or call 911 for help. °· You become more agitated or confused. °· You develop uncontrolled anxiety. °· You begin to see things that are not really there (hallucinate). °Your caregiver has determined that you completely understand your medical condition, and that your mental state is back to normal. You understand that you have been treated for alcohol withdrawal, have agreed  not to drink any alcohol for a minimum of 1 day, will not operate a car or other machinery for 24 hours, and have had an opportunity to ask any questions about your condition. °Document Released: 07/16/2005 Document Revised: 12/29/2011 Document Reviewed: 05/24/2008 °[employer]® Patient Information ©2015 [employer]. This information is not intended to replace advice given to you by your health care provider. Make sure you discuss any questions you have with your health care provider. ° °

## 2016-08-08 ENCOUNTER — Emergency Department (HOSPITAL_COMMUNITY)
Admission: EM | Admit: 2016-08-08 | Discharge: 2016-08-08 | Disposition: A | Discharge status: Home / Self Care | Payer: Self-pay | Attending: Emergency Medicine | Admitting: Emergency Medicine

## 2016-08-08 ENCOUNTER — Encounter (HOSPITAL_COMMUNITY): Payer: Self-pay | Admitting: *Deleted

## 2016-08-08 ENCOUNTER — Emergency Department (HOSPITAL_COMMUNITY): Payer: Self-pay

## 2016-08-08 DIAGNOSIS — L03114 Cellulitis of left upper limb: Secondary | ICD-10-CM | POA: Insufficient documentation

## 2016-08-08 DIAGNOSIS — F17210 Nicotine dependence, cigarettes, uncomplicated: Secondary | ICD-10-CM | POA: Insufficient documentation

## 2016-08-08 LAB — COMPREHENSIVE METABOLIC PANEL
ALT: 35 U/L (ref 17–63)
ANION GAP: 12 (ref 5–15)
AST: 34 U/L (ref 15–41)
Albumin: 4.8 g/dL (ref 3.5–5.0)
Alkaline Phosphatase: 79 U/L (ref 38–126)
BUN: 9 mg/dL (ref 6–20)
CHLORIDE: 108 mmol/L (ref 101–111)
CO2: 22 mmol/L (ref 22–32)
Calcium: 9.8 mg/dL (ref 8.9–10.3)
Creatinine, Ser: 0.83 mg/dL (ref 0.61–1.24)
GFR calc Af Amer: >60 mL/min (ref >60)
Glucose, Bld: 131 mg/dL — ABNORMAL HIGH (ref 65–99)
POTASSIUM: 3.6 mmol/L (ref 3.5–5.1)
Sodium: 142 mmol/L (ref 135–145)
TOTAL PROTEIN: 8.1 g/dL (ref 6.5–8.1)
Total Bilirubin: 0.4 mg/dL (ref 0.3–1.2)

## 2016-08-08 LAB — CBC
HCT: 46.3 % (ref 39.0–52.0)
Hemoglobin: 16.1 g/dL (ref 13.0–17.0)
MCH: 30.5 pg (ref 26.0–34.0)
MCHC: 34.8 g/dL (ref 30.0–36.0)
MCV: 87.7 fL (ref 78.0–100.0)
PLATELETS: 256 10*3/uL (ref 150–400)
RBC: 5.28 MIL/uL (ref 4.22–5.81)
RDW: 14 % (ref 11.5–15.5)
WBC: 9 10*3/uL (ref 4.0–10.5)

## 2016-08-08 MED ORDER — DOXYCYCLINE HYCLATE 100 MG PO CAPS: 100.0000 mg | ORAL_CAPSULE | Freq: Two times a day (BID) | ORAL | 0 refills | Status: AC

## 2016-08-08 MED ORDER — IBUPROFEN 600 MG PO TABS: 600.0000 mg | ORAL_TABLET | Freq: Four times a day (QID) | ORAL | 0 refills | Status: AC | PRN

## 2016-08-08 NOTE — ED Triage Notes (Signed)
Pt c/o L wrist pain since yesterday. Pt started back at [employer] this week. Has been doing lifting. Redness noted from wrist to mid forearm. Pt has been using tylenol and icy hot without relief.

## 2016-08-08 NOTE — ED Provider Notes (Signed)
[facility]-EMERGENCY DEPT Provider Note   CSN: [id number] Arrival date & time: 08/08/16  0104  By signing my name below, I, Soijett Blue, attest that this documentation has been prepared under the direction and in the presence of Zadie Rhine, MD. Electronically Signed: Soijett Blue, ED Scribe. 08/08/16. 3:55 AM.  History   Chief Complaint Chief Complaint  Patient presents with  . Arm Pain    HPI  Duane Mclean is a 30 y.o. male who presents to the Emergency Department complaining of left forearm pain onset yesterday. Pt notes that he recently began working at [employer] and notes that he completes repetitive movements while working. Pt denies recent fall, injury, bite, scratch, or trauma to his left forearm. Pt is having associated symptoms of diarrhea, redness to left forearm, and left forearm swelling. He notes that he has tried icy hot and OTC tylenol cream with no relief of his symptoms. He denies fever, vomiting, left upper arm pain, and any other symptoms. Pt notes that he has consumed ETOH PTA. Pt reports that he is a current cigarette smoker, ETOH consumer, and occasional marijuana user.    The history is provided by the patient. No language interpreter was used.  Arm Pain  This is a new problem. The current episode started yesterday. The problem occurs rarely. The problem has not changed since onset.The symptoms are aggravated by bending (movement). Nothing relieves the symptoms. He has tried acetaminophen (icy hot and tylenol) for the symptoms. The treatment provided no relief.    Past Medical History:  Diagnosis Date  . Depression     There are no active problems to display for this patient.   History reviewed. No pertinent surgical history.     Home Medications    Prior to Admission medications   Medication Sig Start Date End Date Taking? Authorizing Provider  chlordiazePOXIDE (LIBRIUM) 25 MG capsule Take 1 capsule 4 times a day for 2 days Take 1 capsule 3 times a day  for 2 days Take 1 caspule 2 times a day for 2 days Take 1 capsule daily for 2 days 05/17/15   Arthor Captain, PA-C  hydrOXYzine (ATARAX/VISTARIL) 25 MG tablet Take 1 tablet (25 mg total) by mouth every 6 (six) hours. 05/17/15   Arthor Captain, PA-C  LORazepam (ATIVAN) 1 MG tablet Take 1 tablet (1 mg total) by mouth every 4 (four) hours as needed for anxiety. 03/15/14   Benjaman Pott, MD    Family History No family history on file.  Social History Social History  Substance Use Topics  . Smoking status: Current Every Day Smoker    Types: Cigarettes  . Smokeless tobacco: Current User  . Alcohol use 2.4 oz/week    4 Cans of beer per week     Comment: 3-4 40's or more, daily      Allergies   Review of patient's allergies indicates no known allergies.   Review of Systems Review of Systems  Constitutional: Negative for fever.  Gastrointestinal: Positive for diarrhea. Negative for vomiting.  Musculoskeletal: Positive for arthralgias (left forearm pain) and joint swelling (left forearm).  Skin: Positive for color change (redness to left forearm).  All other systems reviewed and are negative.    Physical Exam Updated Vital Signs BP 127/87 (BP Location: Right Arm)   Pulse (!) 135 Comment: palpated patient's wrist to ensure pulse  Temp 98.3 F (36.8 C) (Oral)   Resp 18   Ht 5\' 10"  (1.778 m)   Wt 161 lb 7  oz (73.2 kg)   SpO2 96%   BMI 23.16 kg/m   Physical Exam CONSTITUTIONAL: anxious, mildly agitated HEAD: Normocephalic/atraumatic EYES: EOMI/PERRL ENMT: Mucous membranes moist NECK: supple no meningeal signs SPINE/BACK:entire spine nontender CV: S1/S2 noted, no murmurs/rubs/gallops noted LUNGS: Lungs are clear to auscultation bilaterally, no apparent distress ABDOMEN: soft, nontender, no rebound or guarding, bowel sounds noted throughout abdomen GU:no cva tenderness NEURO: Pt is awake/alert/appropriate, moves all extremitiesx4.  No facial droop.   EXTREMITIES: pulses  normal/equal, full ROM. Tenderness and mild edema to left forearm with overlying erythema. FROM of left wrist and left elbow. No crepitus. No induration.  SKIN: warm, color normal PSYCH: anxious   ED Treatments / Results  DIAGNOSTIC STUDIES: Oxygen Saturation is 96% on RA, nl by my interpretation.    COORDINATION OF CARE: 3:55 AM Discussed treatment plan with pt at bedside which includes labs, left wrist xray, left forearm xray, ace wrap, ice, abx Rx, and pt agreed to plan.    Labs (all labs ordered are listed, but only abnormal results are displayed) Labs Reviewed  COMPREHENSIVE METABOLIC PANEL - Abnormal; Notable for the following:       Result Value   Glucose, Bld 131 (*)    All other components within normal limits  CBC    Radiology Dg Forearm Left  Result Date: 08/08/2016 CLINICAL DATA:  30 year old male with left wrist pain. EXAM: LEFT WRIST - COMPLETE 3+ VIEW; LEFT FOREARM - 2 VIEW COMPARISON:  None. FINDINGS: There is no acute fracture or dislocation. The bones are well mineralized. No arthritic changes. There is mild soft tissue swelling of the wrist. No radiopaque foreign object identified. IMPRESSION: No acute osseous pathology. Mild soft tissue swelling of the wrist. Clinical correlation is recommended. Electronically Signed   By: Elgie Collard M.D.   On: 08/08/2016 02:50   Dg Wrist Complete Left  Result Date: 08/08/2016 CLINICAL DATA:  30 year old male with left wrist pain. EXAM: LEFT WRIST - COMPLETE 3+ VIEW; LEFT FOREARM - 2 VIEW COMPARISON:  None. FINDINGS: There is no acute fracture or dislocation. The bones are well mineralized. No arthritic changes. There is mild soft tissue swelling of the wrist. No radiopaque foreign object identified. IMPRESSION: No acute osseous pathology. Mild soft tissue swelling of the wrist. Clinical correlation is recommended. Electronically Signed   By: Elgie Collard M.D.   On: 08/08/2016 02:50    Procedures Procedures  (including critical care time)  Medications Ordered in ED Medications - No data to display   Initial Impression / Assessment and Plan / ED Course  I have reviewed the triage vital signs and the nursing notes.  Pertinent labs & imaging results that were available during my care of the patient were reviewed by me and considered in my medical decision making (see chart for details).  Clinical Course    Pt stable Vitals improved With localized erythema to left FA Will start on antibiotics as this could be cellulitis though he has also been placing a lot of icy hot on area No signs of septic joint, he can fully left wrist/elbow without difficulty Advised elevated, NSAIDs, doxycycline  Final Clinical Impressions(s) / ED Diagnoses   Final diagnoses:  Cellulitis of left forearm    New Prescriptions New Prescriptions   No medications on file    I personally performed the services described in this documentation, which was scribed in my presence. The recorded information has been reviewed and is accurate.        Dorinda Hill  Bebe ShaggyWickline, MD 08/08/16 [phone]

## 2017-10-20 IMAGING — DX DG FOREARM 2V*L*
2 series · 2 of 2 positions shown · non-contrast
Comparison: None.

CLINICAL DATA: 30-year-old male with left wrist pain.

EXAM:
LEFT WRIST - COMPLETE 3+ VIEW; LEFT FOREARM - 2 VIEW

[forearm ap]
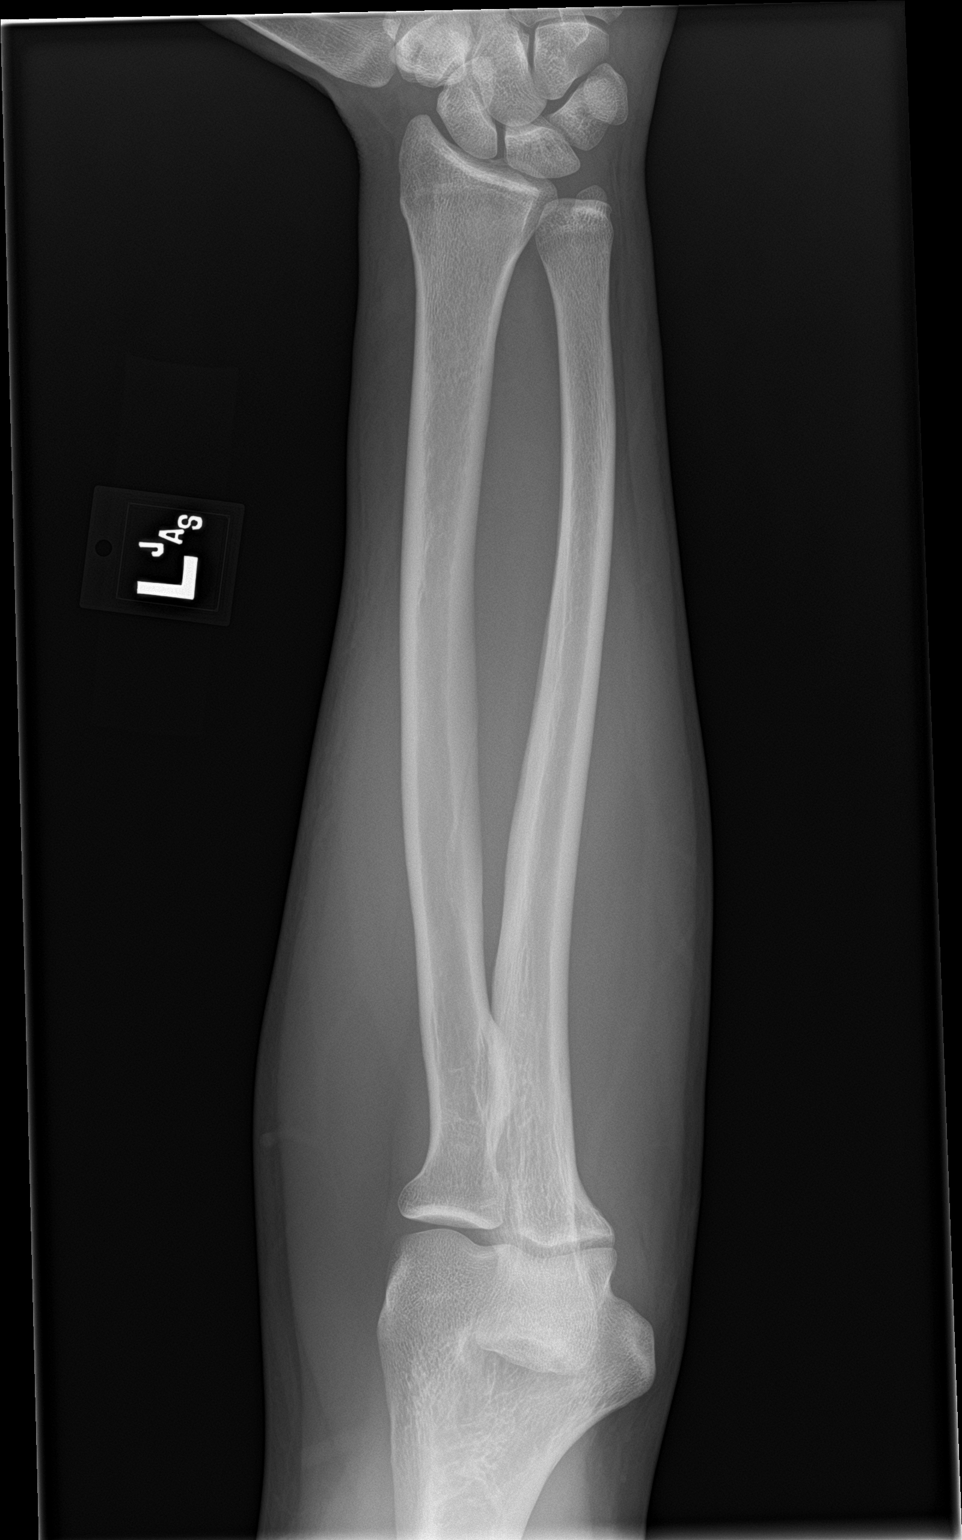

[forearm lat]
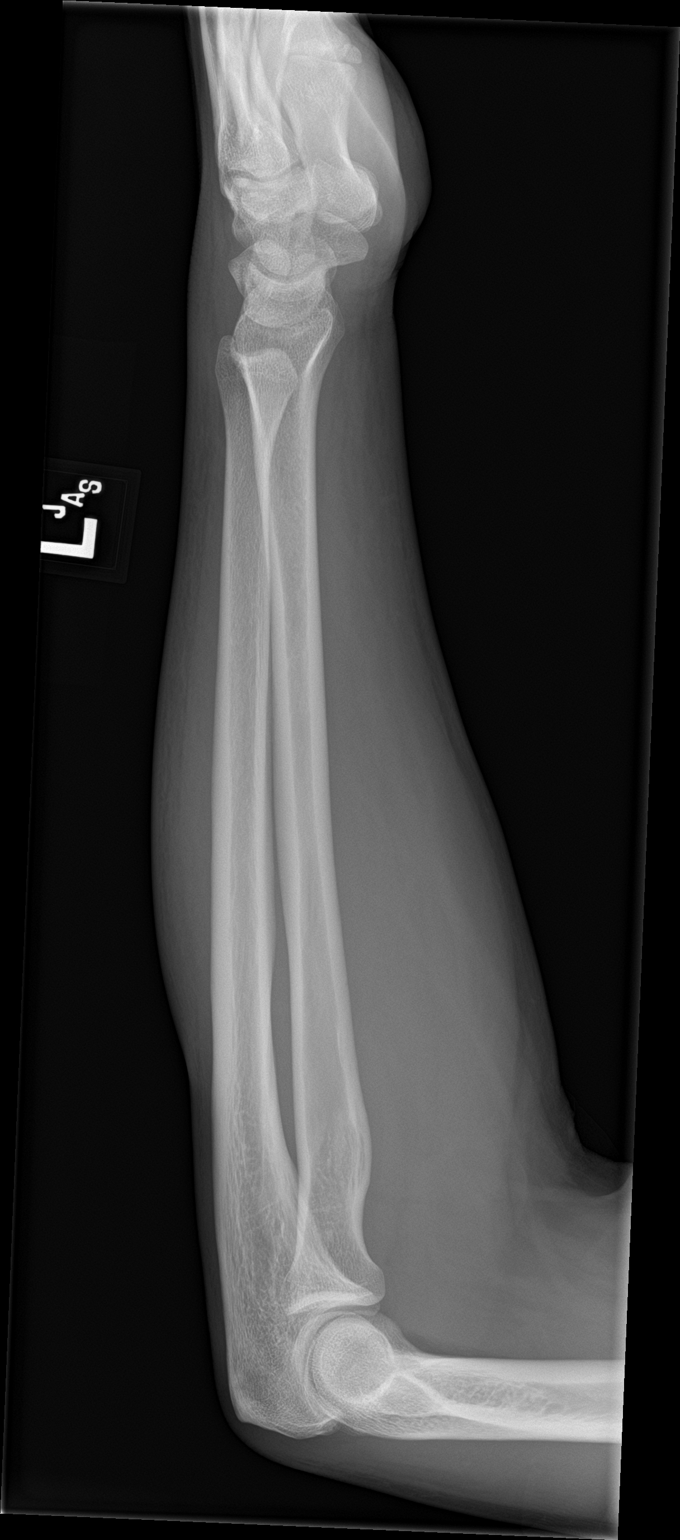

[2 of 2 positions shown; findings below may reference images not displayed]

FINDINGS: There is no acute fracture or dislocation. The bones are well
mineralized. No arthritic changes. There is mild soft tissue
swelling of the wrist. No radiopaque foreign object identified.
IMPRESSION: No acute osseous pathology.

Mild soft tissue swelling of the wrist. Clinical correlation is
recommended.

## 2017-10-20 IMAGING — DX DG WRIST COMPLETE 3+V*L*
4 series · 4 of 4 positions shown · non-contrast
Comparison: None.

CLINICAL DATA: 30-year-old male with left wrist pain.

EXAM:
LEFT WRIST - COMPLETE 3+ VIEW; LEFT FOREARM - 2 VIEW

[wrist pa]
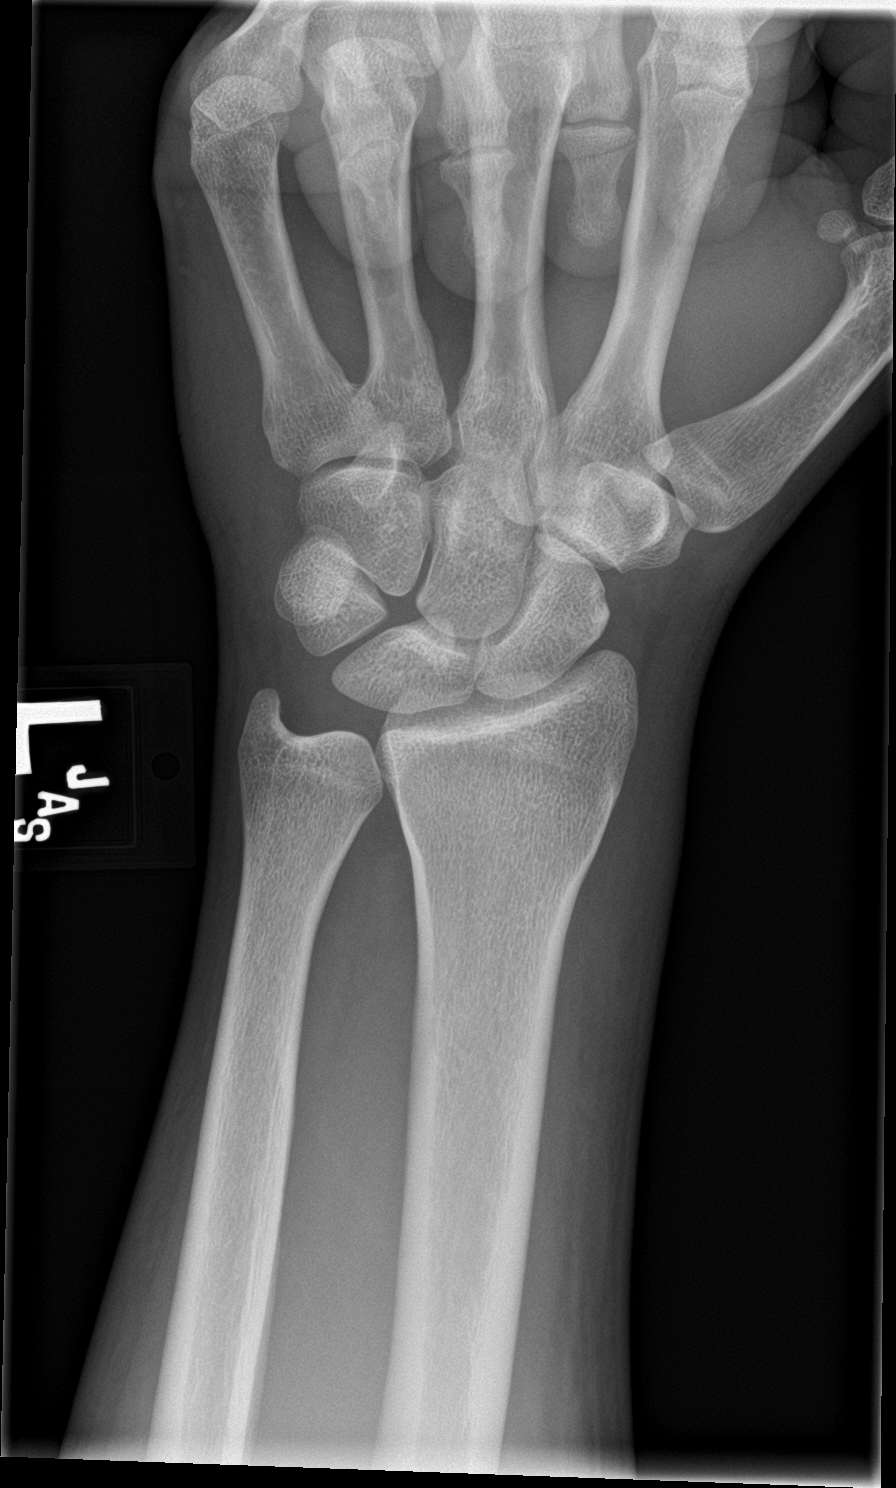

[wrist obl]
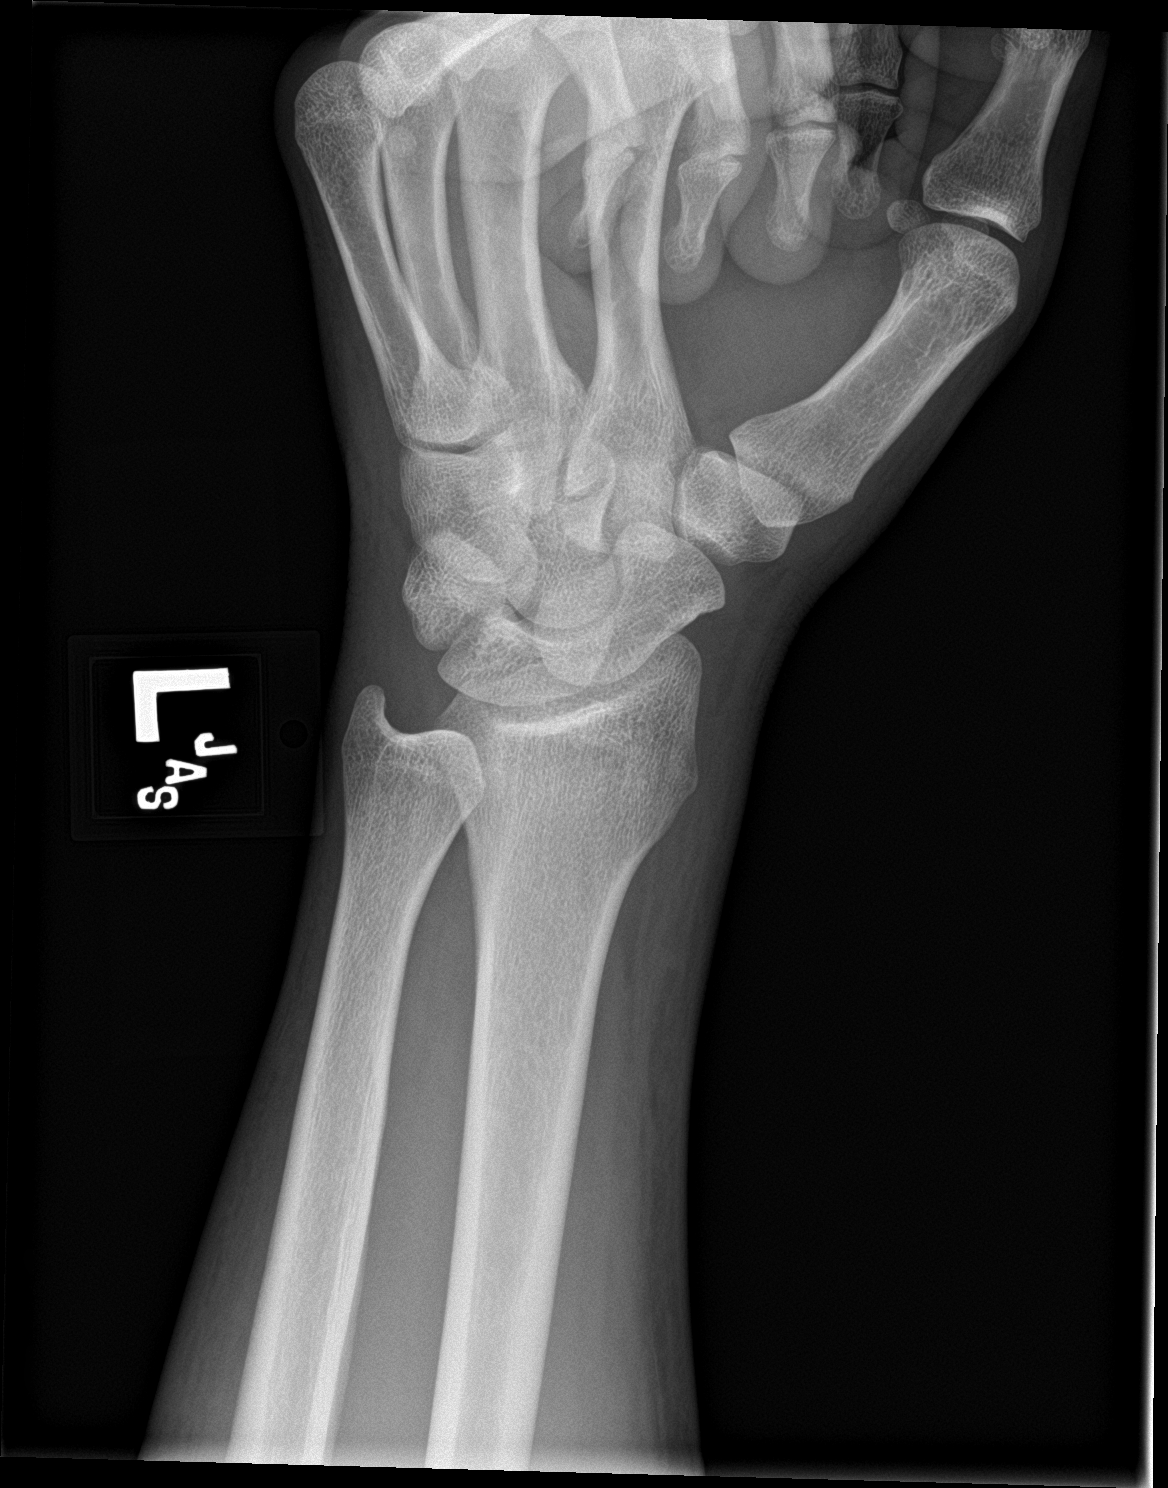

[wrist lat]
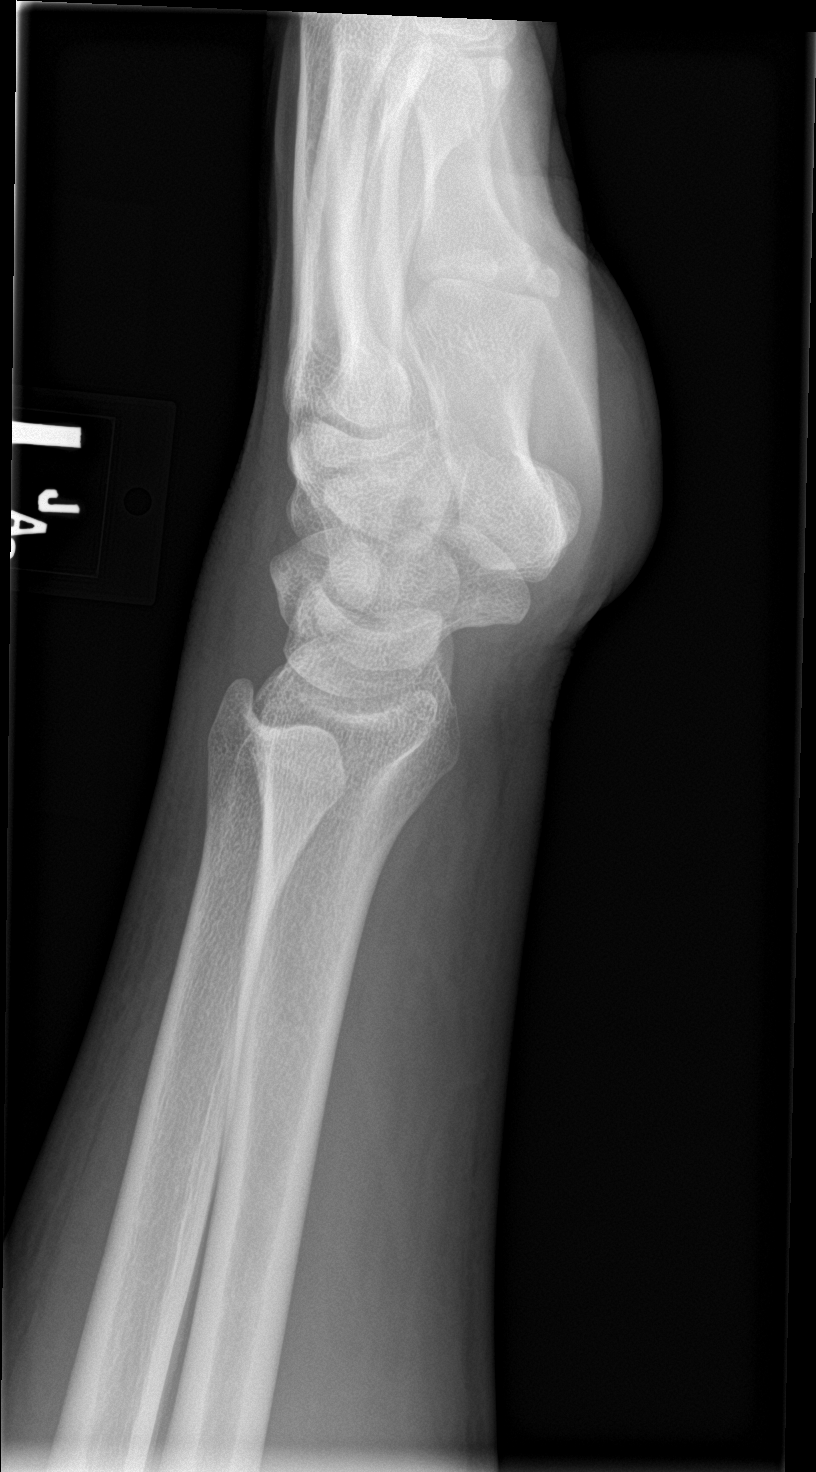

[wrist navicular]
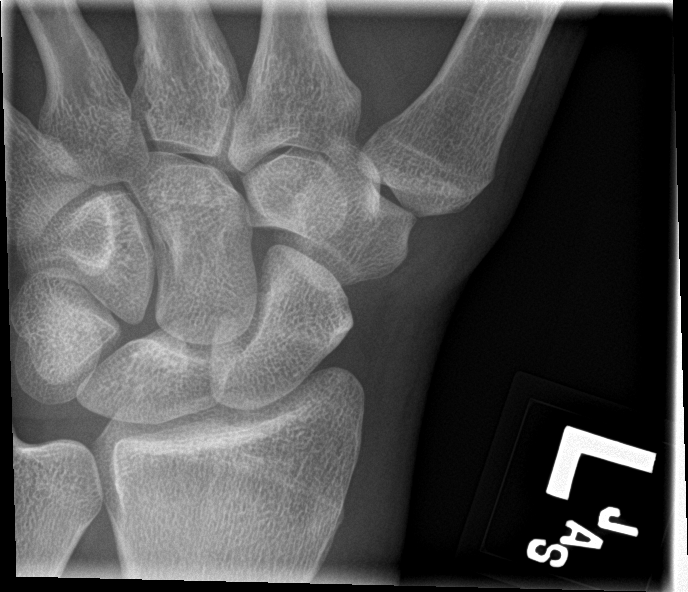

[4 of 4 positions shown; findings below may reference images not displayed]

FINDINGS: There is no acute fracture or dislocation. The bones are well
mineralized. No arthritic changes. There is mild soft tissue
swelling of the wrist. No radiopaque foreign object identified.
IMPRESSION: No acute osseous pathology.

Mild soft tissue swelling of the wrist. Clinical correlation is
recommended.

## 2017-12-10 ENCOUNTER — Emergency Department (HOSPITAL_COMMUNITY): Admission: EM | Admit: 2017-12-10 | Discharge: 2017-12-10 | Discharge status: Against Medical Advice | Payer: Self-pay

## 2017-12-10 NOTE — ED Notes (Signed)
No response

## 2017-12-10 NOTE — ED Notes (Signed)
Pt called for triage no response from lobby 

## 2021-03-02 ENCOUNTER — Emergency Department (HOSPITAL_COMMUNITY): Payer: Self-pay

## 2021-03-02 ENCOUNTER — Emergency Department (HOSPITAL_COMMUNITY)
Admission: EM | Admit: 2021-03-02 | Discharge: 2021-03-02 | Disposition: A | Discharge status: Home / Self Care | Payer: Self-pay | Attending: Emergency Medicine | Admitting: Emergency Medicine

## 2021-03-02 ENCOUNTER — Other Ambulatory Visit: Payer: Self-pay

## 2021-03-02 DIAGNOSIS — S0990XA Unspecified injury of head, initial encounter: Secondary | ICD-10-CM

## 2021-03-02 DIAGNOSIS — F17210 Nicotine dependence, cigarettes, uncomplicated: Secondary | ICD-10-CM | POA: Insufficient documentation

## 2021-03-02 DIAGNOSIS — S0101XA Laceration without foreign body of scalp, initial encounter: Secondary | ICD-10-CM | POA: Insufficient documentation

## 2021-03-02 DIAGNOSIS — M549 Dorsalgia, unspecified: Secondary | ICD-10-CM | POA: Insufficient documentation

## 2021-03-02 DIAGNOSIS — W01198A Fall on same level from slipping, tripping and stumbling with subsequent striking against other object, initial encounter: Secondary | ICD-10-CM | POA: Insufficient documentation

## 2021-03-02 DIAGNOSIS — Z23 Encounter for immunization: Secondary | ICD-10-CM | POA: Insufficient documentation

## 2021-03-02 MED ORDER — TETANUS-DIPHTH-ACELL PERTUSSIS 5-2.5-18.5 LF-MCG/0.5 IM SUSY
0.5000 mL | PREFILLED_SYRINGE | Freq: Once | INTRAMUSCULAR | Status: AC
  Administered 2021-03-02: 0.5 mL via INTRAMUSCULAR
  Filled 2021-03-02: qty 0.5

## 2021-03-02 MED ORDER — LIDOCAINE-EPINEPHRINE (PF) 2 %-1:200000 IJ SOLN
20.0000 mL | Freq: Once | INTRAMUSCULAR | Status: AC
  Administered 2021-03-02: 20 mL via INTRADERMAL
  Filled 2021-03-02: qty 20

## 2021-03-02 NOTE — ED Notes (Signed)
Patient reports he was mudding and slipped and hit his head on a rock and had LOC.  Reports he has been drinking and went home and took a shower but "I lost a lot of blood".

## 2021-03-02 NOTE — ED Triage Notes (Signed)
Patient reports he slipped on rocks and hit his head, small lac to back of head, reports LOC, also with back pain

## 2021-03-02 NOTE — ED Provider Notes (Signed)
[facility] EMERGENCY DEPARTMENT Provider Note  CSN: [id number] Arrival date & time: 03/02/21 4481  Chief Complaint(s) Fall, Headache, Back Pain, and Head Laceration  HPI Duane Mclean is a 35 y.o. male who presents to the emergency department after mechanical fall resulting in head trauma.  Patient reports stepping out of the vehicle and slipping on some rocks causing him to fall backwards onto the rocks.  He denied loss of consciousness.  Did admit to alcohol use during that time.  Denies any neck pain or back pain.  No headache.  No chest pain or shortness of breath.  No extremity pain.  Patient not up-to-date on tetanus vaccination  HPI  Past Medical History Past Medical History:  Diagnosis Date  . Depression    There are no problems to display for this patient.  Home Medication(s) Prior to Admission medications   Medication Sig Start Date End Date Taking? Authorizing Provider  chlordiazePOXIDE (LIBRIUM) 25 MG capsule Take 1 capsule 4 times a day for 2 days Take 1 capsule 3 times a day for 2 days Take 1 caspule 2 times a day for 2 days Take 1 capsule daily for 2 days 05/17/15   Arthor Captain, PA-C  doxycycline (VIBRAMYCIN) 100 MG capsule Take 1 capsule (100 mg total) by mouth 2 (two) times daily. One po bid x 7 days 08/08/16   Zadie Rhine, MD  hydrOXYzine (ATARAX/VISTARIL) 25 MG tablet Take 1 tablet (25 mg total) by mouth every 6 (six) hours. 05/17/15   Arthor Captain, PA-C  ibuprofen (ADVIL,MOTRIN) 600 MG tablet Take 1 tablet (600 mg total) by mouth every 6 (six) hours as needed. 08/08/16   Zadie Rhine, MD  LORazepam (ATIVAN) 1 MG tablet Take 1 tablet (1 mg total) by mouth every 4 (four) hours as needed for anxiety. 03/15/14   Benjaman Pott, MD                                                                                                                                    Past Surgical History No past surgical history on file. Family History No  family history on file.  Social History Social History   Tobacco Use  . Smoking status: Current Every Day Smoker    Types: Cigarettes  . Smokeless tobacco: Current User  Substance Use Topics  . Alcohol use: Yes    Alcohol/week: 4.0 standard drinks    Types: 4 Cans of beer per week    Comment: 3-4 40's or more, daily   . Drug use: No   Allergies Patient has no known allergies.  Review of Systems Review of Systems All other systems are reviewed and are negative for acute change except as noted in the HPI  Physical Exam Vital Signs  I have reviewed the triage vital signs BP 121/90 (BP Location: Right Arm)   Pulse 98   Temp 98.4 F (36.9 C) (Oral)  Resp 18   Ht 5\' 10"  (1.778 m)   Wt 72.6 kg   SpO2 99%   BMI 22.96 kg/m   Physical Exam Constitutional:      General: He is not in acute distress.    Appearance: He is well-developed. He is not diaphoretic.  HENT:     Head: Normocephalic. Laceration present.      Right Ear: External ear normal.     Left Ear: External ear normal.  Eyes:     General: No scleral icterus.       Right eye: No discharge.        Left eye: No discharge.     Conjunctiva/sclera: Conjunctivae normal.     Pupils: Pupils are equal, round, and reactive to light.  Cardiovascular:     Rate and Rhythm: Regular rhythm.     Pulses:          Radial pulses are 2+ on the right side and 2+ on the left side.       Dorsalis pedis pulses are 2+ on the right side and 2+ on the left side.     Heart sounds: Normal heart sounds. No murmur heard. No friction rub. No gallop.   Pulmonary:     Effort: Pulmonary effort is normal. No respiratory distress.     Breath sounds: Normal breath sounds. No stridor.  Abdominal:     General: There is no distension.     Palpations: Abdomen is soft.     Tenderness: There is no abdominal tenderness.  Musculoskeletal:     Cervical back: Normal range of motion and neck supple. No bony tenderness.     Thoracic back: No bony  tenderness.     Lumbar back: No bony tenderness.     Comments: Clavicle stable. Chest stable to AP/Lat compression. Pelvis stable to Lat compression. No obvious extremity deformity. No chest or abdominal wall contusion.  Skin:    General: Skin is warm.  Neurological:     Mental Status: He is alert and oriented to person, place, and time.     GCS: GCS eye subscore is 4. GCS verbal subscore is 5. GCS motor subscore is 6.     Comments: Moving all extremities      ED Results and Treatments Labs (all labs ordered are listed, but only abnormal results are displayed) Labs Reviewed - No data to display                                                                                                                       EKG  EKG Interpretation  Date/Time:    Ventricular Rate:    PR Interval:    QRS Duration:   QT Interval:    QTC Calculation:   R Axis:     Text Interpretation:        Radiology CT Head Wo Contrast  Result Date: 03/02/2021 CLINICAL DATA:  Head trauma with abnormal mental status. Intoxication EXAM: CT HEAD WITHOUT CONTRAST TECHNIQUE:  Contiguous axial images were obtained from the base of the skull through the vertex without intravenous contrast. COMPARISON:  06/25/2011 FINDINGS: Brain: No evidence of swelling, infarction, hemorrhage, hydrocephalus, extra-axial collection or mass lesion/mass effect. Vascular: Negative Skull: Posterior scalp swelling.  No acute fracture. Sinuses/Orbits: Negative IMPRESSION: 1. No evidence of intracranial injury. 2. Scalp swelling without fracture. Electronically Signed   By: Marnee Spring M.D.   On: 03/02/2021 04:36    Pertinent labs & imaging results that were available during my care of the patient were reviewed by me and considered in my medical decision making (see chart for details).  Medications Ordered in ED Medications  lidocaine-EPINEPHrine (XYLOCAINE W/EPI) 2 %-1:200000 (PF) injection 20 mL (20 mLs Intradermal Given 03/02/21  0537)  Tdap (BOOSTRIX) injection 0.5 mL (0.5 mLs Intramuscular Given 03/02/21 0536)                                                                                                                                    Procedures .Marland KitchenLaceration Repair  Date/Time: 03/02/2021 6:44 AM Performed by: Nira Conn, MD Authorized by: Nira Conn, MD   Consent:    Consent obtained:  Verbal   Consent given by:  Patient   Risks discussed:  Infection, need for additional repair, poor wound healing, poor cosmetic result and vascular damage   Alternatives discussed:  Delayed treatment Universal protocol:    Procedure explained and questions answered to patient or proxy's satisfaction: yes     Relevant documents present and verified: yes     Patient identity confirmed:  Arm band and verbally with patient Anesthesia:    Anesthesia method:  None Laceration details:    Location:  Scalp   Scalp location:  Occipital   Length (cm):  1.5   Depth (mm):  5 Pre-procedure details:    Preparation:  Patient was prepped and draped in usual sterile fashion and imaging obtained to evaluate for foreign bodies Exploration:    Hemostasis achieved with:  Direct pressure   Wound extent: no fascia violation noted, no foreign bodies/material noted, no underlying fracture noted and no vascular damage noted   Treatment:    Area cleansed with:  Saline   Amount of cleaning:  Extensive   Irrigation solution:  Sterile saline   Irrigation volume:  500cc   Irrigation method:  Pressure wash Skin repair:    Repair method:  Staples   Number of staples:  3 Approximation:    Approximation:  Close Repair type:    Repair type:  Simple Post-procedure details:    Dressing:  Non-adherent dressing   Procedure completion:  Tolerated    (including critical care time)  Medical Decision Making / ED Course I have reviewed the nursing notes for this encounter and the patient's prior records (if available in EHR  or on provided paperwork).   Duane Mclean was evaluated in Emergency Department on 03/02/2021 for the symptoms  described in the history of present illness. He was evaluated in the context of the global COVID-19 pandemic, which necessitated consideration that the patient might be at risk for infection with the SARS-CoV-2 virus that causes COVID-19. Institutional protocols and algorithms that pertain to the evaluation of patients at risk for COVID-19 are in a state of rapid change based on information released by regulatory bodies including the CDC and federal and state organizations. These policies and algorithms were followed during the patient's care in the ED.  Mechanical fall resulting in head trauma.  Positive EtOH.  CT obtained to rule out ICH and negative.  No other injuries noted on exam requiring imaging or work-up. Laceration was thoroughly irrigated and closed as above. Tetanus updated.      Final Clinical Impression(s) / ED Diagnoses Final diagnoses:  Injury of head, initial encounter  Laceration of scalp, initial encounter    The patient appears reasonably screened and/or stabilized for discharge and I doubt any other medical condition or other [facility] requiring further screening, evaluation, or treatment in the ED at this time prior to discharge. Safe for discharge with strict return precautions.  Disposition: Discharge  Condition: Good  I have discussed the results, Dx and Tx plan with the patient/family who expressed understanding and agree(s) with the plan. Discharge instructions discussed at length. The patient/family was given strict return precautions who verbalized understanding of the instructions. No further questions at time of discharge.    ED Discharge Orders    None        Follow Up: [facility] EMERGENCY DEPARTMENT [address] [id number] mc [place] [address] [phone]  in 5-7 days, for staple  removal     This chart was dictated using voice recognition software.  Despite best efforts to proofread,  errors can occur which can change the documentation meaning.   Nira Conn, MD 03/02/21 [phone]

## 2021-03-10 ENCOUNTER — Other Ambulatory Visit: Payer: Self-pay

## 2021-03-10 ENCOUNTER — Emergency Department (HOSPITAL_COMMUNITY)
Admission: EM | Admit: 2021-03-10 | Discharge: 2021-03-10 | Disposition: A | Discharge status: Home / Self Care | Payer: Self-pay | Attending: Emergency Medicine | Admitting: Emergency Medicine

## 2021-03-10 ENCOUNTER — Encounter (HOSPITAL_COMMUNITY): Payer: Self-pay | Admitting: Emergency Medicine

## 2021-03-10 DIAGNOSIS — Z4802 Encounter for removal of sutures: Secondary | ICD-10-CM | POA: Insufficient documentation

## 2021-03-10 DIAGNOSIS — F17210 Nicotine dependence, cigarettes, uncomplicated: Secondary | ICD-10-CM | POA: Insufficient documentation

## 2021-03-10 NOTE — Discharge Instructions (Signed)
Keep the wound clean and dry.  Return to emergency department for any redness or swelling, drainage, fevers.

## 2021-03-10 NOTE — ED Provider Notes (Signed)
[facility] EMERGENCY DEPARTMENT Provider Note   CSN: [id number] Arrival date & time: 03/10/21  1159     History Chief Complaint  Patient presents with  . Suture / Staple Removal    Duane Mclean is a 35 y.o. male who presents for evaluation of suture removal.  He had staples placed in his head last week.  He comes today to get them taken out.  He has not noted any drainage, fevers, swelling.  The history is provided by the patient.       Past Medical History:  Diagnosis Date  . Depression     There are no problems to display for this patient.   History reviewed. No pertinent surgical history.     No family history on file.  Social History   Tobacco Use  . Smoking status: Current Every Day Smoker    Types: Cigarettes  . Smokeless tobacco: Current User  Substance Use Topics  . Alcohol use: Yes    Alcohol/week: 4.0 standard drinks    Types: 4 Cans of beer per week    Comment: 3-4 40's or more, daily   . Drug use: No    Home Medications Prior to Admission medications   Medication Sig Start Date End Date Taking? Authorizing Provider  chlordiazePOXIDE (LIBRIUM) 25 MG capsule Take 1 capsule 4 times a day for 2 days Take 1 capsule 3 times a day for 2 days Take 1 caspule 2 times a day for 2 days Take 1 capsule daily for 2 days 05/17/15   Arthor Captain, PA-C  doxycycline (VIBRAMYCIN) 100 MG capsule Take 1 capsule (100 mg total) by mouth 2 (two) times daily. One po bid x 7 days 08/08/16   Zadie Rhine, MD  hydrOXYzine (ATARAX/VISTARIL) 25 MG tablet Take 1 tablet (25 mg total) by mouth every 6 (six) hours. 05/17/15   Arthor Captain, PA-C  ibuprofen (ADVIL,MOTRIN) 600 MG tablet Take 1 tablet (600 mg total) by mouth every 6 (six) hours as needed. 08/08/16   Zadie Rhine, MD  LORazepam (ATIVAN) 1 MG tablet Take 1 tablet (1 mg total) by mouth every 4 (four) hours as needed for anxiety. 03/15/14   Benjaman Pott, MD    Allergies    Patient has  no known allergies.  Review of Systems   Review of Systems  Constitutional: Negative for fever.  Skin: Positive for wound. Negative for color change.    Physical Exam Updated Vital Signs BP (!) 127/98 (BP Location: Left Arm)   Pulse (!) 102   Temp 98.6 F (37 C) (Oral)   Resp 16   SpO2 98%   Physical Exam Vitals and nursing note reviewed.  Constitutional:      Appearance: He is well-developed.  HENT:     Head: Normocephalic and atraumatic.      Comments: Staples in place noted to posterior head.  There are some scabbed over area but no active bleeding, surrounding warmth, erythema.  No active drainage. Eyes:     General: No scleral icterus.       Right eye: No discharge.        Left eye: No discharge.     Conjunctiva/sclera: Conjunctivae normal.  Pulmonary:     Effort: Pulmonary effort is normal.  Skin:    General: Skin is warm and dry.  Neurological:     Mental Status: He is alert.  Psychiatric:        Speech: Speech normal.  Behavior: Behavior normal.     ED Results / Procedures / Treatments   Labs (all labs ordered are listed, but only abnormal results are displayed) Labs Reviewed - No data to display  EKG None  Radiology No results found.  Procedures .Suture Removal  Date/Time: 03/10/2021 12:28 PM Performed by: Maxwell Caul, PA-C Authorized by: Maxwell Caul, PA-C   Consent:    Consent obtained:  Verbal   Consent given by:  Patient   Risks, benefits, and alternatives were discussed: yes     Risks discussed:  Bleeding, pain and wound separation   Alternatives discussed:  No treatment Universal protocol:    Procedure explained and questions answered to patient or proxy's satisfaction: yes     Relevant documents present and verified: yes     Test results available: no     Imaging studies available: no     Required blood products, implants, devices, and special equipment available: no     Site/side marked: yes     Immediately  prior to procedure, a time out was called: yes     Patient identity confirmed:  Verbally with patient Location:    Location:  Head/neck   Head/neck location:  Scalp Procedure details:    Wound appearance:  No signs of infection   Number of staples removed:  3 Post-procedure details:    Procedure completion:  Tolerated     Medications Ordered in ED Medications - No data to display  ED Course  I have reviewed the triage vital signs and the nursing notes.  Pertinent labs & imaging results that were available during my care of the patient were reviewed by me and considered in my medical decision making (see chart for details).    MDM Rules/Calculators/A&P                          34 year old male who presents for evaluation of needing sutures out.  He was seen here last week for evaluation of head lack and had 3 staples present.  Comes in today to get it taken out.  He has not noted any drainage, fevers, color change.  On initial arrival, he is afebrile, toxic appearing.  Vital signs are stable.  On evaluation, he has staples noted to posterior head.  There is some scabbed over areas but no surrounding warmth, erythema, active drainage or wound concerning for infectious process.  Sutures removed as documented above.  Patient tolerated procedure well.  Encouraged at home supportive care measures. At this time, patient exhibits no emergent life-threatening condition that require further evaluation in ED. Patient had ample opportunity for questions and discussion. All patient's questions were answered with full understanding. Strict return precautions discussed. Patient expresses understanding and agreement to plan.   Portions of this note were generated with Scientist, clinical (histocompatibility and immunogenetics). Dictation errors may occur despite best attempts at proofreading.    Final Clinical Impression(s) / ED Diagnoses Final diagnoses:  Visit for suture removal    Rx / DC Orders ED Discharge Orders    None        Rosana Hoes 03/10/21 1229    Jacalyn Lefevre, MD 03/10/21 1241

## 2021-03-10 NOTE — ED Triage Notes (Signed)
Pt here for staple removal. Seen last Sunday after a fall. VSS.

## 2022-05-14 IMAGING — CT CT HEAD W/O CM
4 series · 17 of 47 positions shown, 19 images · non-contrast
Comparison: 06/25/2011

CLINICAL DATA: Head trauma with abnormal mental status.
Intoxication

EXAM:
CT HEAD WITHOUT CONTRAST
TECHNIQUE: Contiguous axial images were obtained from the base of the skull
through the vertex without intravenous contrast.

[Series 3: head bone · axial · 0.45mm/px · z∈[-117,-61]mm · 4 of 79 slices shown]
[im 8/79  bone]
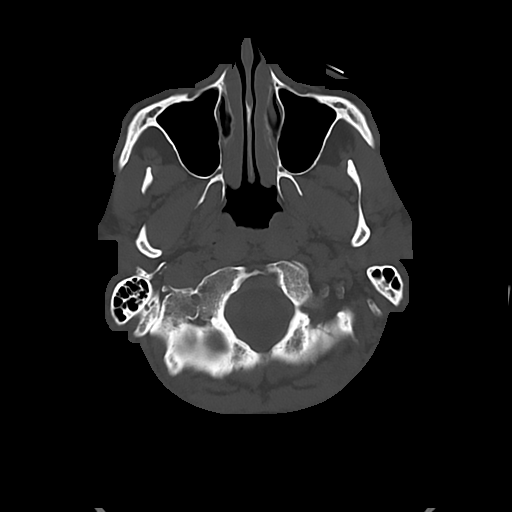
[im 16/79  bone]
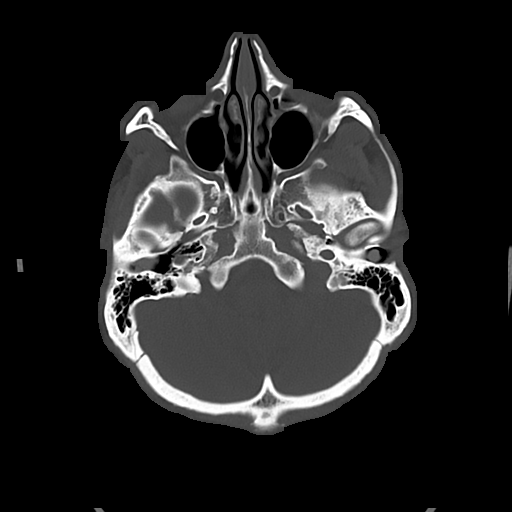
[im 24/79  bone]
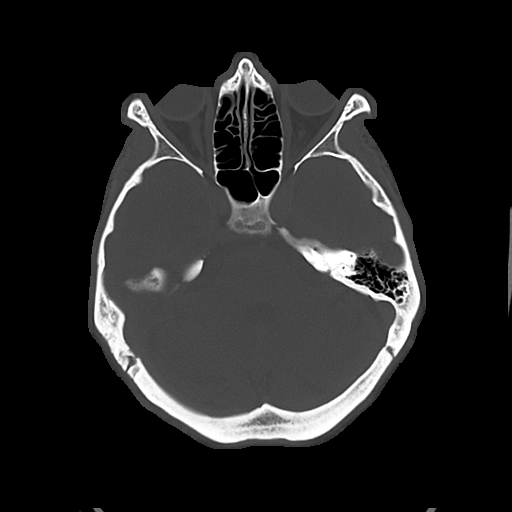
[im 36/79  bone]
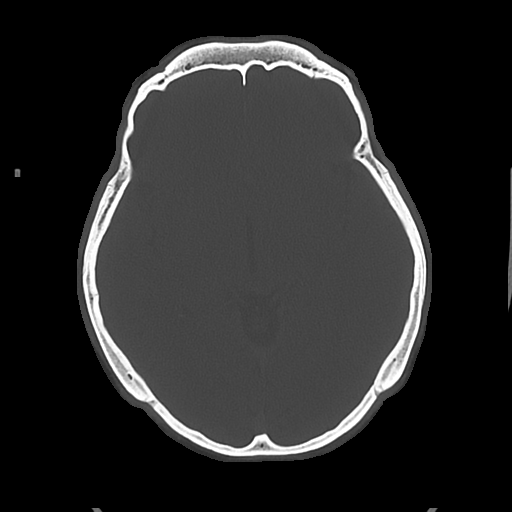

[Series 4: cor soft · coronal · 0.33mm/px · 3 of 70 slices shown]
[im 24/70  brain]
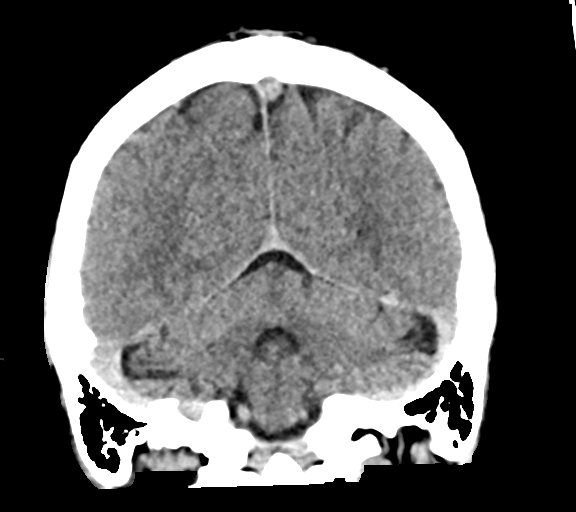
[im 31/70  brain]
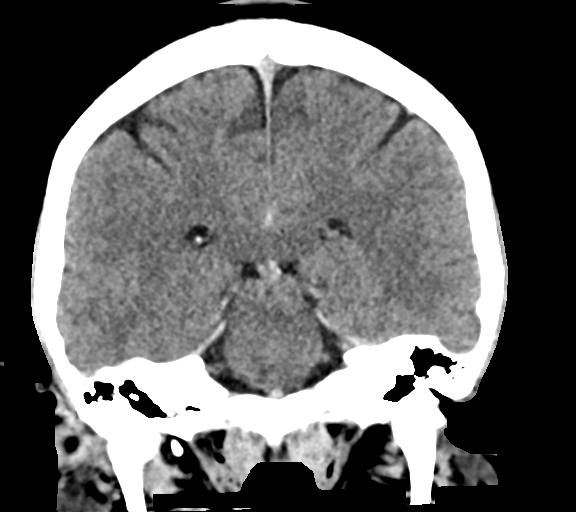
[im 39/70  brain]
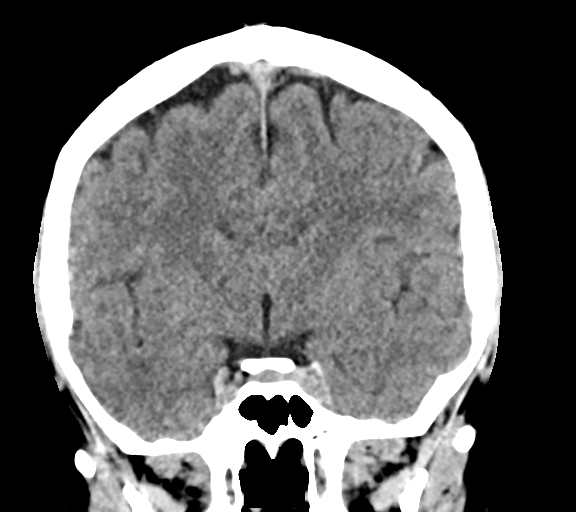

[Series 5: sag soft · sagittal · 0.36mm/px · 3 of 63 slices shown]
[im 21/63  brain]
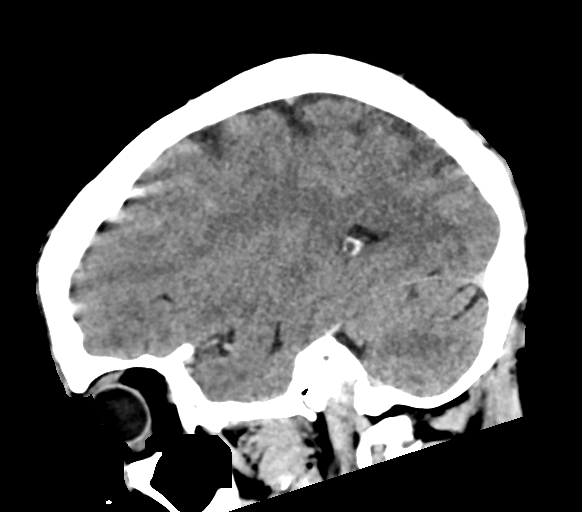
[im 32/63  brain]
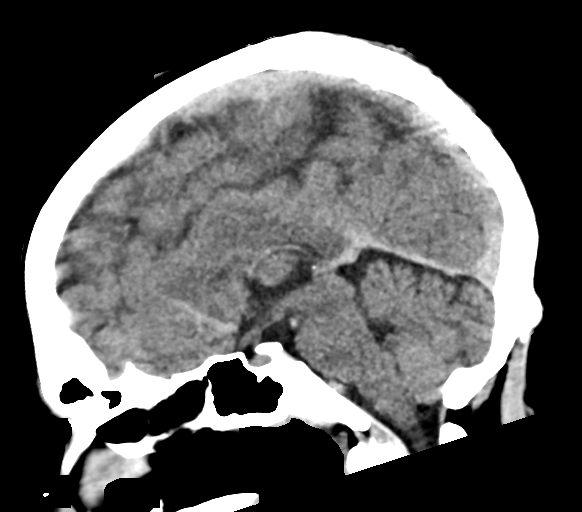
[im 42/63  brain]
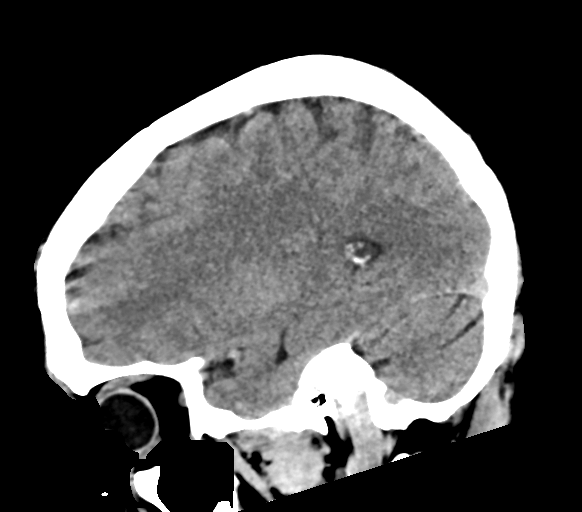

[Series 6: head wo · axial · 0.45mm/px · z∈[-116,+4]mm · 7 of 32 slices shown, 9 images]
[im 4/32  brain]
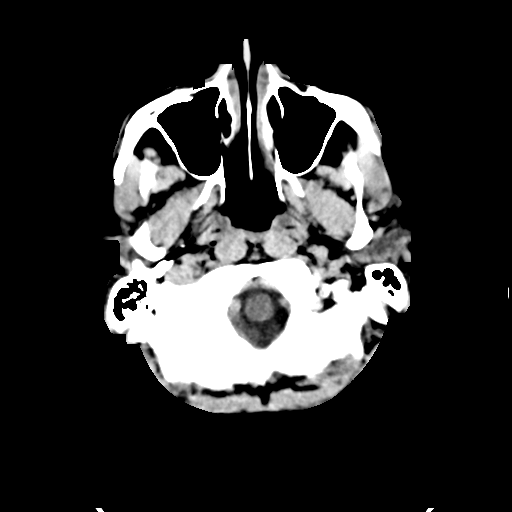
[im 4/32  bone]
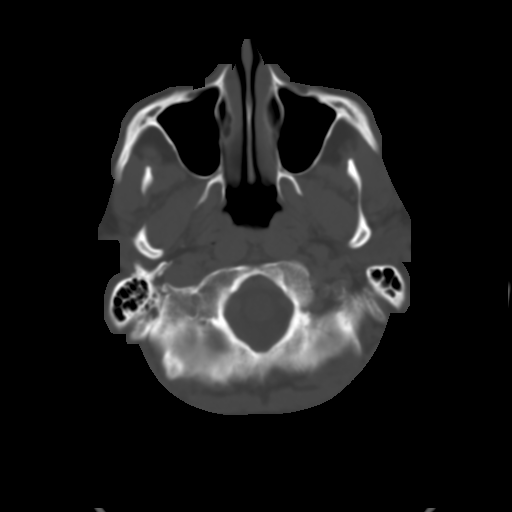
[im 8/32  brain]
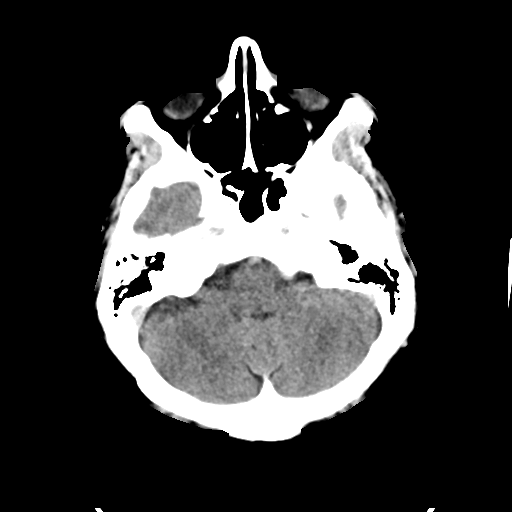
[im 12/32  brain]
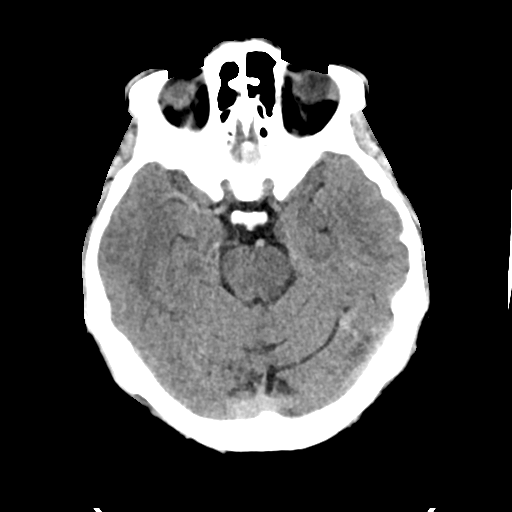
[im 16/32  brain]
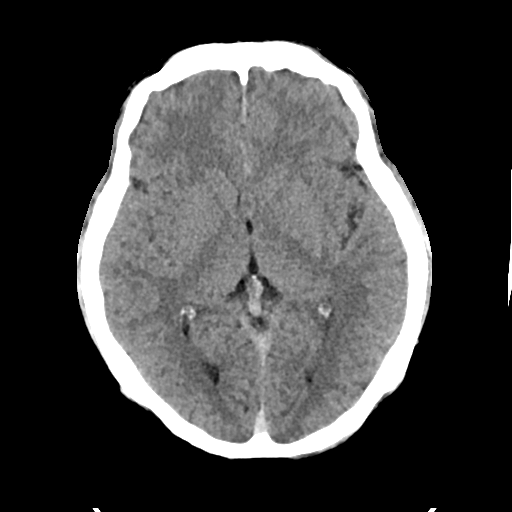
[im 20/32  brain]
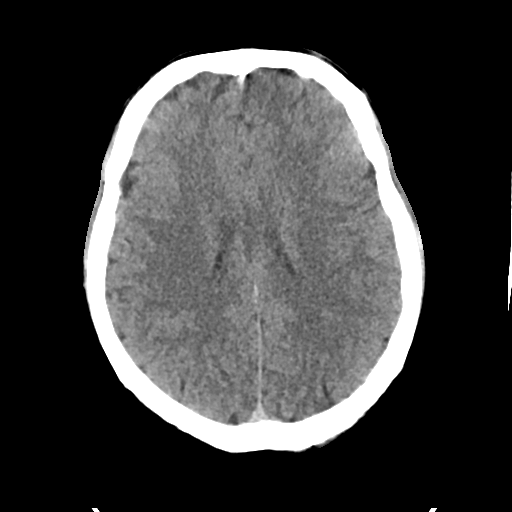
[im 20/32  bone]
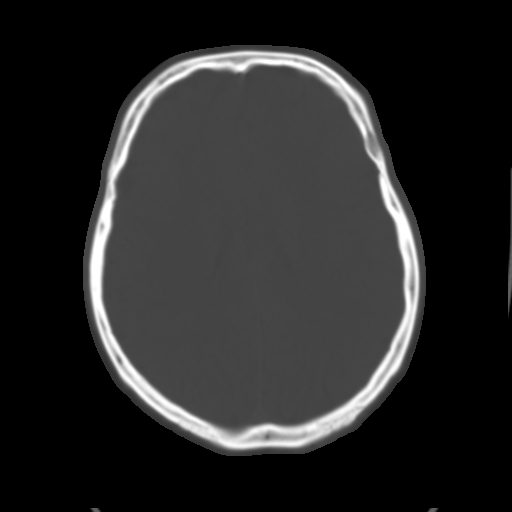
[im 24/32  brain]
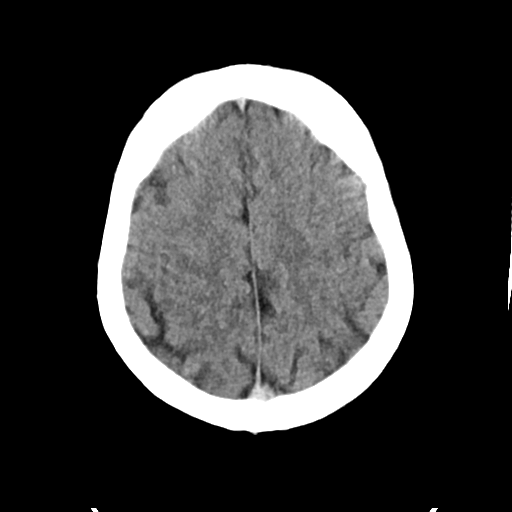
[im 28/32  brain]
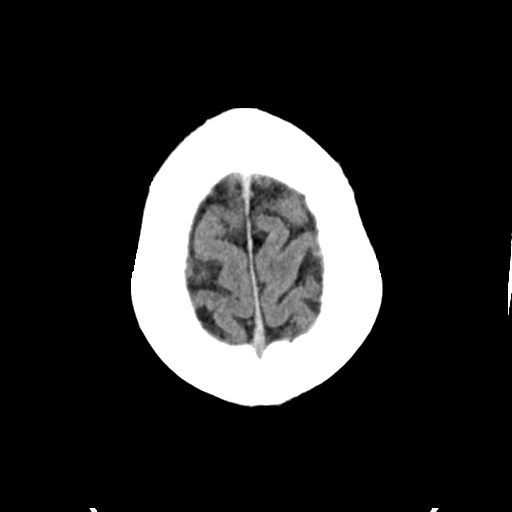

[17 of 47 positions shown; findings below may reference images not displayed]

FINDINGS: Brain: No evidence of swelling, infarction, hemorrhage,
hydrocephalus, extra-axial collection or mass lesion/mass effect.

Vascular: Negative

Skull: Posterior scalp swelling.  No acute fracture.

Sinuses/Orbits: Negative
IMPRESSION: 1. No evidence of intracranial injury.
2. Scalp swelling without fracture.
